# Patient Record
Sex: Female | Born: 1940 | Race: Black or African American | Hispanic: No | Marital: Married | Smoking: Never smoker
Health system: Southern US, Community
[De-identification: ages and names within clinical notes are randomized; demographics above are authoritative.]

## PROBLEM LIST (undated history)

## (undated) DIAGNOSIS — E119 Type 2 diabetes mellitus without complications: Secondary | ICD-10-CM

## (undated) DIAGNOSIS — I10 Essential (primary) hypertension: Secondary | ICD-10-CM

## (undated) DIAGNOSIS — E785 Hyperlipidemia, unspecified: Secondary | ICD-10-CM

## (undated) HISTORY — DX: Essential (primary) hypertension: I10

## (undated) HISTORY — PX: REPLACEMENT TOTAL KNEE: SUR1224

## (undated) HISTORY — DX: Type 2 diabetes mellitus without complications: E11.9

## (undated) HISTORY — DX: Hyperlipidemia, unspecified: E78.5

---

## 2012-01-04 ENCOUNTER — Emergency Department: Payer: Self-pay | Admitting: Internal Medicine

## 2015-03-27 ENCOUNTER — Other Ambulatory Visit: Payer: Self-pay | Admitting: Unknown Physician Specialty

## 2015-03-27 DIAGNOSIS — M1712 Unilateral primary osteoarthritis, left knee: Secondary | ICD-10-CM

## 2015-03-27 DIAGNOSIS — M1711 Unilateral primary osteoarthritis, right knee: Secondary | ICD-10-CM

## 2015-04-05 ENCOUNTER — Ambulatory Visit
Admission: RE | Admit: 2015-04-05 | Discharge: 2015-04-05 | Disposition: A | Discharge status: Home / Self Care | Payer: Medicare Other | Source: Ambulatory Visit | Attending: Unknown Physician Specialty | Admitting: Unknown Physician Specialty

## 2015-04-05 DIAGNOSIS — M7122 Synovial cyst of popliteal space [Baker], left knee: Secondary | ICD-10-CM | POA: Insufficient documentation

## 2015-04-05 DIAGNOSIS — M7051 Other bursitis of knee, right knee: Secondary | ICD-10-CM | POA: Insufficient documentation

## 2015-04-05 DIAGNOSIS — M7121 Synovial cyst of popliteal space [Baker], right knee: Secondary | ICD-10-CM | POA: Diagnosis not present

## 2015-04-05 DIAGNOSIS — S83242A Other tear of medial meniscus, current injury, left knee, initial encounter: Secondary | ICD-10-CM | POA: Diagnosis not present

## 2015-04-05 DIAGNOSIS — M1711 Unilateral primary osteoarthritis, right knee: Secondary | ICD-10-CM | POA: Insufficient documentation

## 2015-04-05 DIAGNOSIS — S83241A Other tear of medial meniscus, current injury, right knee, initial encounter: Secondary | ICD-10-CM | POA: Diagnosis not present

## 2015-04-05 DIAGNOSIS — M25462 Effusion, left knee: Secondary | ICD-10-CM | POA: Insufficient documentation

## 2015-04-05 DIAGNOSIS — M1712 Unilateral primary osteoarthritis, left knee: Secondary | ICD-10-CM

## 2015-04-05 DIAGNOSIS — X58XXXA Exposure to other specified factors, initial encounter: Secondary | ICD-10-CM | POA: Diagnosis not present

## 2015-07-03 ENCOUNTER — Other Ambulatory Visit
Admission: RE | Admit: 2015-07-03 | Discharge: 2015-07-03 | Disposition: A | Discharge status: Home / Self Care | Payer: Medicare Other | Source: Ambulatory Visit | Attending: Unknown Physician Specialty | Admitting: Unknown Physician Specialty

## 2015-07-03 DIAGNOSIS — M1712 Unilateral primary osteoarthritis, left knee: Secondary | ICD-10-CM | POA: Diagnosis present

## 2015-07-03 LAB — SYNOVIAL CELL COUNT + DIFF, W/ CRYSTALS
Crystals, Fluid: NONE SEEN
Eosinophils-Synovial: 1 %
LYMPHOCYTES-SYNOVIAL FLD: 12 %
Monocyte-Macrophage-Synovial Fluid: 6 %
Neutrophil, Synovial: 81 %
WBC, Synovial: 16108 /mm3 — ABNORMAL HIGH (ref 0–200)

## 2015-07-07 LAB — BODY FLUID CULTURE: Culture: NO GROWTH

## 2016-07-02 ENCOUNTER — Ambulatory Visit (INDEPENDENT_AMBULATORY_CARE_PROVIDER_SITE_OTHER): Payer: Medicare Other | Admitting: Vascular Surgery

## 2016-07-09 ENCOUNTER — Ambulatory Visit (INDEPENDENT_AMBULATORY_CARE_PROVIDER_SITE_OTHER): Payer: Medicare Other | Admitting: Vascular Surgery

## 2016-07-23 ENCOUNTER — Encounter (INDEPENDENT_AMBULATORY_CARE_PROVIDER_SITE_OTHER): Payer: Self-pay | Admitting: Vascular Surgery

## 2016-07-23 ENCOUNTER — Ambulatory Visit (INDEPENDENT_AMBULATORY_CARE_PROVIDER_SITE_OTHER): Payer: Medicare Other | Admitting: Vascular Surgery

## 2016-07-23 DIAGNOSIS — E119 Type 2 diabetes mellitus without complications: Secondary | ICD-10-CM

## 2016-07-23 DIAGNOSIS — I890 Lymphedema, not elsewhere classified: Secondary | ICD-10-CM | POA: Diagnosis not present

## 2016-07-23 DIAGNOSIS — M150 Primary generalized (osteo)arthritis: Secondary | ICD-10-CM

## 2016-07-23 DIAGNOSIS — I10 Essential (primary) hypertension: Secondary | ICD-10-CM

## 2016-07-23 DIAGNOSIS — M159 Polyosteoarthritis, unspecified: Secondary | ICD-10-CM

## 2016-07-26 DIAGNOSIS — I10 Essential (primary) hypertension: Secondary | ICD-10-CM | POA: Insufficient documentation

## 2016-07-26 DIAGNOSIS — E119 Type 2 diabetes mellitus without complications: Secondary | ICD-10-CM | POA: Insufficient documentation

## 2016-07-26 DIAGNOSIS — M1990 Unspecified osteoarthritis, unspecified site: Secondary | ICD-10-CM | POA: Insufficient documentation

## 2016-07-26 DIAGNOSIS — I890 Lymphedema, not elsewhere classified: Secondary | ICD-10-CM | POA: Insufficient documentation

## 2016-07-26 NOTE — Progress Notes (Signed)
MRN : [id number]  [name] is a 76 y.o. (11-17-1940) female who presents with chief complaint of  Chief Complaint  Patient presents with  . Follow-up  .  History of Present Illness: The patient returns to the office for followup evaluation regarding leg swelling.  The swelling has improved quite a bit and the pain associated with swelling has decreased substantially. There have not been any interval development of a ulcerations or wounds.  Since the previous visit the patient has been wearing graduated compression stockings and has noted little significant improvement in the lymphedema. The patient has been using compression routinely morning until night.  The patient also states elevation during the day and exercise is being done too.    Current Meds  Medication Sig  . acetaminophen (TYLENOL) 500 MG tablet Take 500 mg by mouth every 6 (six) hours as needed.   Marland Kitchen. amLODipine (NORVASC) 5 MG tablet Take by mouth daily.   Marland Kitchen. aspirin EC 81 MG tablet Take 81 mg by mouth.  Marland Kitchen. atorvastatin (LIPITOR) 40 MG tablet Take 40 mg by mouth.  . calcium carbonate (CALCIUM 600) 600 MG TABS tablet Take by mouth daily.   Marland Kitchen. levothyroxine (SYNTHROID, LEVOTHROID) 88 MCG tablet Take by mouth daily.   . metFORMIN (GLUCOPHAGE) 500 MG tablet Take 500 mg by mouth 2 (two) times daily.   . vitamin B-12 (CYANOCOBALAMIN) 500 MCG tablet Take by mouth daily.     Past Medical History:  Diagnosis Date  . Diabetes mellitus without complication (HCC)   . Hyperlipidemia   . Hypertension     Past Surgical History:  Procedure Laterality Date  . REPLACEMENT TOTAL KNEE Left     Social History Social History  Substance Use Topics  . Smoking status: Never Smoker  . Smokeless tobacco: Never Used  . Alcohol use No    Family History Family History  Problem Relation Age of Onset  . Stroke Mother   . Heart disease Father   No family history of bleeding/clotting disorders, porphyria or autoimmune  disease   No Known Allergies   REVIEW OF SYSTEMS (Negative unless checked)  Constitutional: [] Weight loss  [] Fever  [] Chills Cardiac: [] Chest pain   [] Chest pressure   [] Palpitations   [] Shortness of breath when laying flat   [] Shortness of breath with exertion. Vascular:  [] Pain in legs with walking   [] Pain in legs at rest  [] History of DVT   [] Phlebitis   [x] Swelling in legs   [] Varicose veins   [] Non-healing ulcers Pulmonary:   [] Uses home oxygen   [] Productive cough   [] Hemoptysis   [] Wheeze  [] COPD   [] Asthma Neurologic:  [] Dizziness   [] Seizures   [] History of stroke   [] History of TIA  [] Aphasia   [] Vissual changes   [] Weakness or numbness in arm   [] Weakness or numbness in leg Musculoskeletal:   [] Joint swelling   [] Joint pain   [] Low back pain Hematologic:  [] Easy bruising  [] Easy bleeding   [] Hypercoagulable state   [] Anemic Gastrointestinal:  [] Diarrhea   [] Vomiting  [] Gastroesophageal reflux/heartburn   [] Difficulty swallowing. Genitourinary:  [] Chronic kidney disease   [] Difficult urination  [] Frequent urination   [] Blood in urine Skin:  [] Rashes   [] Ulcers  Psychological:  [] History of anxiety   []  History of major depression.  Physical Examination  Vitals:   07/23/16 1318  BP: 134/78  Pulse: 69  Resp: 16  Weight: 222 lb (100.7 kg)  Height: 5\' 4"  (1.626 m)   Body mass  index is 38.11 kg/m. Gen: WD/WN, NAD Head: [place]/AT, No temporalis wasting.  Ear/Nose/Throat: Hearing grossly intact, nares w/o erythema or drainage, poor dentition Eyes: PER, EOMI, sclera nonicteric.  Neck: Supple, no masses.  No bruit or JVD.  Pulmonary:  Good air movement, clear to auscultation bilaterally, no use of accessory muscles.  Cardiac: RRR, normal S1, S2, no Murmurs. Vascular:  2+ edema left > right ankle area pitting Vessel Right Left  Radial Palpable Palpable  Ulnar Palpable Palpable  Brachial Palpable Palpable  Carotid Palpable Palpable  Femoral Palpable Palpable  Popliteal  Palpable Palpable  PT Palpable Palpable  DP Palpable Palpable   Gastrointestinal: soft, non-distended. No guarding/no peritoneal signs.  Musculoskeletal: M/S 5/5 throughout.  No deformity or atrophy.  Neurologic: CN 2-12 intact. Pain and light touch intact in extremities.  Symmetrical.  Speech is fluent. Motor exam as listed above. Psychiatric: Judgment intact, Mood & affect appropriate for pt's clinical situation. Dermatologic: No rashes or ulcers noted.  No changes consistent with cellulitis. Lymph : No Cervical lymphadenopathy, no lichenification or skin changes of chronic lymphedema.  CBC No results found for: WBC, HGB, HCT, MCV, PLT  BMET No results found for: NA, K, CL, CO2, GLUCOSE, BUN, CREATININE, CALCIUM, GFRNONAA, GFRAA CrCl cannot be calculated (No order found.).  COAG No results found for: INR, PROTIME  Radiology No results found.   Assessment/Plan 1. Lymphedema No surgery or intervention at this point in time.  I have reviewed my discussion with the patient regarding venous insufficiency and why it causes symptoms. I have discussed with the patient the chronic skin changes that accompany venous insufficiency and the long term sequela such as ulceration. Patient will contnue wearing graduated compression stockings on a daily basis, as this has provided excellent control of his edema. The patient will put the stockings on first thing in the morning and removing them in the evening. The patient is reminded not to sleep in the stockings.  In addition, behavioral modification including elevation during the day will be initiated. Exercise is strongly encouraged.  Given the patient's good control and lack of any problems regarding the venous insufficiency and lymphedema a lymph pump in not need at this time.  The patient will follow up with me PRN should anything change.  The patient voices agreement with this plan.   2. Primary osteoarthritis involving multiple  joints Continue NSAID medications as already ordered, these medications have been reviewed and there are no changes at this time.   3. Controlled type 2 diabetes mellitus without complication, without long-term current use of insulin (HCC) Continue hypoglycemic medications as already ordered, these medications have been reviewed and there are no changes at this time.  Hgb A1C to be monitored as already arranged by primary service   4. Essential hypertension Continue antihypertensive medications as already ordered, these medications have been reviewed and there are no changes at this time.   Levora Dredge, MD  07/26/2016 1:28 PM

## 2018-07-17 ENCOUNTER — Ambulatory Visit
Admission: EM | Admit: 2018-07-17 | Discharge: 2018-07-17 | Disposition: A | Discharge status: Home / Self Care | Payer: Medicare Other | Attending: Family Medicine | Admitting: Family Medicine

## 2018-07-17 ENCOUNTER — Ambulatory Visit (INDEPENDENT_AMBULATORY_CARE_PROVIDER_SITE_OTHER): Payer: Medicare Other

## 2018-07-17 ENCOUNTER — Encounter: Payer: Self-pay | Admitting: Emergency Medicine

## 2018-07-17 ENCOUNTER — Other Ambulatory Visit: Payer: Self-pay

## 2018-07-17 DIAGNOSIS — J069 Acute upper respiratory infection, unspecified: Secondary | ICD-10-CM

## 2018-07-17 MED ORDER — DOXYCYCLINE HYCLATE 100 MG PO CAPS: 100.0000 mg | ORAL_CAPSULE | Freq: Two times a day (BID) | ORAL | 0 refills | Status: DC

## 2018-07-17 MED ORDER — BENZONATATE 100 MG PO CAPS: 100.0000 mg | ORAL_CAPSULE | Freq: Three times a day (TID) | ORAL | 0 refills | Status: DC | PRN

## 2018-07-17 NOTE — ED Provider Notes (Signed)
[facility] ____________________________________________  Time seen: Approximately 10:14 AM  I have reviewed the triage vital signs and the nursing notes.   HISTORY  Chief Complaint Cough   HPI [name] is a 78 y.o. female presenting with spouse at bedside for evaluation of 1 week of cough and congestion complaints.  States initially felt like it started off only has a cold, but reports is moved more to her chest.  Still has some nasal congestion.  No sinus pain.  States feels like she can hear somewhat of a wheeze sound at night.  Intermittent cough.  Denies chest pain or shortness of breath.  Denies known fevers.  Has continued to eat and drink well.  Has not been taken over the counter medication for the same complaints.  Denies known sick contacts.  Reports otherwise doing well denies other complaints.  Danielle Dess, MD: PCP    Past Medical History:  Diagnosis Date  . Diabetes mellitus without complication (HCC)   . Hyperlipidemia   . Hypertension     Patient Active Problem List   Diagnosis Date Noted  . Lymphedema 07/26/2016  . DJD (degenerative joint disease) 07/26/2016  . Diabetes type 2, controlled (HCC) 07/26/2016  . Essential hypertension 07/26/2016    Past Surgical History:  Procedure Laterality Date  . REPLACEMENT TOTAL KNEE Left      No current facility-administered medications for this encounter.   Current Outpatient Medications:  .  amLODipine (NORVASC) 5 MG tablet, Take by mouth daily. , Disp: , Rfl:  .  aspirin EC 81 MG tablet, Take 81 mg by mouth., Disp: , Rfl:  .  atorvastatin (LIPITOR) 40 MG tablet, Take 40 mg by mouth., Disp: , Rfl:  .  calcium carbonate (CALCIUM 600) 600 MG TABS tablet, Take by mouth daily. , Disp: , Rfl:  .  levothyroxine (SYNTHROID, LEVOTHROID) 88 MCG tablet, Take by mouth daily. , Disp: , Rfl:  .  metFORMIN (GLUCOPHAGE) 500 MG tablet, Take 500 mg by mouth 2 (two) times daily. , Disp: , Rfl:  .   vitamin B-12 (CYANOCOBALAMIN) 500 MCG tablet, Take by mouth daily. , Disp: , Rfl:  .  acetaminophen (TYLENOL) 500 MG tablet, Take 500 mg by mouth every 6 (six) hours as needed. , Disp: , Rfl:  .  benzonatate (TESSALON PERLES) 100 MG capsule, Take 1 capsule (100 mg total) by mouth 3 (three) times daily as needed for cough., Disp: 15 capsule, Rfl: 0 .  doxycycline (VIBRAMYCIN) 100 MG capsule, Take 1 capsule (100 mg total) by mouth 2 (two) times daily., Disp: 20 capsule, Rfl: 0  Allergies Patient has no known allergies.  Family History  Problem Relation Age of Onset  . Stroke Mother   . Heart disease Father     Social History Social History   Tobacco Use  . Smoking status: Never Smoker  . Smokeless tobacco: Never Used  Substance Use Topics  . Alcohol use: No  . Drug use: No    Review of Systems Constitutional: No fever/chills ENT: No sore throat. As above.  Cardiovascular: Denies chest pain. Respiratory: Denies shortness of breath. Gastrointestinal: No abdominal pain.  Genitourinary: Negative for dysuria. Musculoskeletal: Negative for back pain. Denies extremity edema.  Skin: Negative for rash.  ____________________________________________   PHYSICAL EXAM:  VITAL SIGNS: ED Triage Vitals  Enc Vitals Group     BP 07/17/18 0928 135/73     Pulse Rate 07/17/18 0928 60     Resp 07/17/18 0928 16  Temp 07/17/18 0928 98.5 F (36.9 C)     Temp Source 07/17/18 0928 Oral     SpO2 07/17/18 0928 98 %     Weight 07/17/18 0926 209 lb (94.8 kg)     Height 07/17/18 0926 5\' 4"  (1.626 m)     Head Circumference --      Peak Flow --      Pain Score 07/17/18 0926 0     Pain Loc --      Pain Edu? --      Excl. in [place]? --    Constitutional: Alert and oriented. Well appearing and in no acute distress. Eyes: Conjunctivae are normal.  Head: Atraumatic. No sinus tenderness to palpation. No swelling. No erythema.  Ears: no erythema, normal TMs bilaterally.   Nose:Nasal  congestion  Mouth/Throat: Mucous membranes are moist. No pharyngeal erythema. No tonsillar swelling or exudate.  Neck: No stridor.  No cervical spine tenderness to palpation. Hematological/Lymphatic/Immunilogical: No cervical lymphadenopathy. Cardiovascular: Normal rate, regular rhythm. Grossly normal heart sounds.  Good peripheral circulation. Respiratory: Normal respiratory effort.  No retractions. No wheezes. Mild scattered rhonchi. Good air movement.  Musculoskeletal: Ambulatory with steady gait. No lower extremity edema noted.  Neurologic:  Normal speech and language. No gait instability. Skin:  Skin appears warm, dry and intact. No rash noted. Psychiatric: Mood and affect are normal. Speech and behavior are normal. ___________________________________________   LABS (all labs ordered are listed, but only abnormal results are displayed)  Labs Reviewed - No data to display  RADIOLOGY  Dg Chest 2 View  Result Date: 07/17/2018 CLINICAL DATA:  Cough and congestion EXAM: CHEST - 2 VIEW COMPARISON:  None. FINDINGS: There is elevation of the left hemidiaphragm. There is no edema or consolidation. The heart size and pulmonary vascularity are normal. Aorta is mildly prominent in the arch region. No adenopathy. There is degenerative change in the thoracic spine. IMPRESSION: There is a degree of left hemidiaphragm elevation of uncertain chronicity. No edema or consolidation. Mild prominence of the aorta in the arch region may be indicative of chronic hypertension. Electronically Signed   By: Bretta Bang III M.D.   On: 07/17/2018 10:36   ____________________________________________   PROCEDURES Procedures   INITIAL IMPRESSION / ASSESSMENT AND PLAN / ED COURSE  Pertinent labs & imaging results that were available during my care of the patient were reviewed by me and considered in my medical decision making (see chart for details).  Well-appearing patient.  Cough and congestion  complaints, scattered rhonchi, evaluated chest x-ray.  Chest x-ray as above per radiologist and reviewed results with patient, no consolidation or edema. Follow-up with primary care this week.  Will treat with oral doxycycline and PRN Tessalon Perles.  Encourage rest, fluids, supportive care.Discussed indication, risks and benefits of medications with patient.  Discussed follow up with Primary care physician this week. Discussed follow up and return parameters including no resolution or any worsening concerns. Patient verbalized understanding and agreed to plan.   ____________________________________________   FINAL CLINICAL IMPRESSION(S) / ED DIAGNOSES  Final diagnoses:  Upper respiratory tract infection, unspecified type     ED Discharge Orders         Ordered    doxycycline (VIBRAMYCIN) 100 MG capsule  2 times daily     07/17/18 1044    benzonatate (TESSALON PERLES) 100 MG capsule  3 times daily PRN     07/17/18 1044           Note: This dictation was prepared  with Dragon dictation along with smaller phrase technology. Any transcriptional errors that result from this process are unintentional.         Renford DillsMiller, Hydeia Mcatee, NP 07/17/18 1221

## 2018-07-17 NOTE — ED Triage Notes (Signed)
Patient c/o cough and chest congestion for a week.  

## 2018-07-17 NOTE — Discharge Instructions (Addendum)
Take medication as prescribed. Rest. Drink plenty of fluids.  ° °Follow up with your primary care physician this week for follow up. Return to Urgent care for new or worsening concerns.  ° °

## 2019-09-23 ENCOUNTER — Ambulatory Visit
Admission: EM | Admit: 2019-09-23 | Discharge: 2019-09-23 | Disposition: A | Discharge status: Home / Self Care | Payer: Medicare PPO | Attending: Family Medicine | Admitting: Family Medicine

## 2019-09-23 ENCOUNTER — Other Ambulatory Visit: Payer: Self-pay

## 2019-09-23 ENCOUNTER — Ambulatory Visit (INDEPENDENT_AMBULATORY_CARE_PROVIDER_SITE_OTHER): Payer: Medicare PPO

## 2019-09-23 DIAGNOSIS — M2550 Pain in unspecified joint: Secondary | ICD-10-CM | POA: Diagnosis not present

## 2019-09-23 DIAGNOSIS — M25571 Pain in right ankle and joints of right foot: Secondary | ICD-10-CM | POA: Diagnosis not present

## 2019-09-23 MED ORDER — PREDNISONE 10 MG PO TABS: ORAL_TABLET | ORAL | 0 refills | Status: DC

## 2019-09-23 NOTE — ED Triage Notes (Signed)
Patient states that she has been having right ankle pain with swelling that is off and on x 1 month. Patient states that she is also having right shoulder pain and right hand pain. Denies any known injury.

## 2019-09-23 NOTE — Discharge Instructions (Signed)
Prednisone as prescribed.  Follow up with PCP regarding referral to rheumatology.  Take care  Dr. Adriana Simas

## 2019-09-23 NOTE — ED Provider Notes (Signed)
[facility]    CSN: [id number] Arrival date & time: 09/23/19  1429      History   Chief Complaint Chief Complaint  Patient presents with  . Ankle Pain    right   HPI  79 year old female presents with joint pain.  Patient reports that she has had ongoing joint pain for the past 3 to 4 weeks.  She reports she is particularly bothered by right ankle pain and swelling.  She reports difficulty with activity.  Seems to be worse with activity.  No relieving factors.  She has been seen by her primary care physician regarding this.  I have reviewed the note and her laboratory studies.  No recent injury.  Rates her pain as 9/10 in severity.  She also reports ongoing pain of the hands as well as the shoulders bilaterally.  No other associated symptoms.  No other complaints.  Past Medical History:  Diagnosis Date  . Diabetes mellitus without complication ([place])   . Hyperlipidemia   . Hypertension     Patient Active Problem List   Diagnosis Date Noted  . Lymphedema 07/26/2016  . DJD (degenerative joint disease) 07/26/2016  . Diabetes type 2, controlled ([place]) 07/26/2016  . Essential hypertension 07/26/2016    Past Surgical History:  Procedure Laterality Date  . REPLACEMENT TOTAL KNEE Left     OB History   No obstetric history on file.      Home Medications    Prior to Admission medications   Medication Sig Start Date End Date Taking? Authorizing Provider  acetaminophen (TYLENOL) 500 MG tablet Take 500 mg by mouth every 6 (six) hours as needed.    Yes [provider]  amLODipine (NORVASC) 5 MG tablet Take by mouth daily.  05/23/14  Yes [provider]  aspirin EC 81 MG tablet Take 81 mg by mouth. 07/25/12  Yes [provider]  atorvastatin (LIPITOR) 40 MG tablet Take 40 mg by mouth. 07/22/16 09/23/19 Yes [provider]  calcium carbonate (CALCIUM 600) 600 MG TABS tablet Take by mouth daily.    Yes [provider]    levothyroxine (SYNTHROID, LEVOTHROID) 88 MCG tablet Take by mouth daily.  10/15/15  Yes [provider]  metFORMIN (GLUCOPHAGE) 500 MG tablet Take 500 mg by mouth 2 (two) times daily.  05/26/16 09/23/19 Yes [provider]  vitamin B-12 (CYANOCOBALAMIN) 500 MCG tablet Take by mouth daily.    Yes [provider]  predniSONE (DELTASONE) 10 MG tablet 50 mg daily x 2 days, then 40 mg daily x 2 days, then 30 mg daily x 2 days, then 20 mg daily x 2 days, then 10 mg daily x 2 days. 09/23/19   Coral Spikes, DO    Family History Family History  Problem Relation Age of Onset  . Stroke Mother   . Heart disease Father     Social History Social History   Tobacco Use  . Smoking status: Never Smoker  . Smokeless tobacco: Never Used  Substance Use Topics  . Alcohol use: No  . Drug use: No     Allergies   Patient has no known allergies.  Review of Systems Review of Systems  Constitutional: Negative for fever.  Musculoskeletal: Positive for arthralgias.   Physical Exam Triage Vital Signs ED Triage Vitals  Enc Vitals Group     BP 09/23/19 1445 (!) 172/91     Pulse Rate 09/23/19 1445 67     Resp 09/23/19 1445 18  Temp 09/23/19 1445 98 F (36.7 C)     Temp Source 09/23/19 1445 Oral     SpO2 09/23/19 1445 100 %     Weight 09/23/19 1442 200 lb (90.7 kg)     Height 09/23/19 1442 5' 4"  (1.626 m)     Head Circumference --      Peak Flow --      Pain Score 09/23/19 1441 9     Pain Loc --      Pain Edu? --      Excl. in [place]? --    Updated Vital Signs BP (!) 172/91 (BP Location: Right Arm)   Pulse 67   Temp 98 F (36.7 C) (Oral)   Resp 18   Ht 5' 4"  (1.626 m)   Wt 90.7 kg   SpO2 100%   BMI 34.33 kg/m   Visual Acuity Right Eye Distance:   Left Eye Distance:   Bilateral Distance:    Right Eye Near:   Left Eye Near:    Bilateral Near:     Physical Exam Vitals and nursing note reviewed.  Constitutional:      General: She is not in acute  distress.    Appearance: Normal appearance.  Eyes:     General:        Right eye: No discharge.        Left eye: No discharge.     Conjunctiva/sclera: Conjunctivae normal.  Cardiovascular:     Rate and Rhythm: Normal rate and regular rhythm.     Heart sounds: No murmur.  Pulmonary:     Effort: Pulmonary effort is normal.     Breath sounds: Normal breath sounds. No wheezing, rhonchi or rales.  Musculoskeletal:     Comments: Right ankle -swelling, and warmth noted.  Tender to palpation over the medial malleolus.  Hands - no apparent synovitis at this time.  Neurological:     Mental Status: She is alert.  Psychiatric:        Mood and Affect: Mood normal.        Behavior: Behavior normal.    UC Treatments / Results  Labs (all labs ordered are listed, but only abnormal results are displayed) Labs Reviewed - No data to display  EKG   Radiology DG Ankle Complete Right  Result Date: 09/23/2019 CLINICAL DATA:  Pain and swelling for 1 month EXAM: RIGHT ANKLE - COMPLETE 3+ VIEW COMPARISON:  None. FINDINGS: There is no evidence of fracture, dislocation, or joint effusion. Plantar and posterior calcaneal enthesophytes are noted. Soft tissue swelling surrounds the ankle. IMPRESSION: No acute osseous abnormality. Soft tissue swelling surrounds the ankle. Electronically Signed   By: Zerita Boers M.D.   On: 09/23/2019 15:13    Procedures Procedures (including critical care time)  Medications Ordered in UC Medications - No data to display  Initial Impression / Assessment and Plan / UC Course  I have reviewed the triage vital signs and the nursing notes.  Pertinent labs & imaging results that were available during my care of the patient were reviewed by me and considered in my medical decision making (see chart for details).    79 year old female presents with polyarthralgia.  She is most bothered by right ankle pain and swelling.    Patient recently seen by her primary care  physician on 3/19.  Note was reviewed and is summarized as follows (as it relates to her current complaints): Reported right ankle pain, pain and stiffness in her hands.  Concern for  rheumatologic disease.  Laboratory studies obtained.   Labs were reviewed: Uric acid 5.8.  ESR elevated at 44.  CRP elevated at 15.4.  Rheumatoid factor normal at 11.8.  ANA was positive.  CCP was elevated at 69.  Labs concerning for RA.  Referral to rheumatology has been placed by her primary care physician.  X-ray of the right ankle obtained today.  Swelling was noted.  No osseous abnormality.  Based off of presentation, review of her primary care physician's note, review of her recent laboratory studies, and her clinical exam today I am concerned that she has rheumatoid arthritis.  Placing on burst of prednisone.  Advised follow-up with primary care physician regarding status of referral to rheumatology.  Final Clinical Impressions(s) / UC Diagnoses   Final diagnoses:  Polyarthralgia     Discharge Instructions     Prednisone as prescribed.  Follow up with PCP regarding referral to rheumatology.  Take care  Dr. Lacinda Axon    ED Prescriptions    Medication Sig Dispense Auth. Provider   predniSONE (DELTASONE) 10 MG tablet 50 mg daily x 2 days, then 40 mg daily x 2 days, then 30 mg daily x 2 days, then 20 mg daily x 2 days, then 10 mg daily x 2 days. 30 tablet Coral Spikes, DO     PDMP not reviewed this encounter.   Coral Spikes, [place] 09/23/19 1532

## 2021-01-24 ENCOUNTER — Ambulatory Visit
Admission: EM | Admit: 2021-01-24 | Discharge: 2021-01-24 | Disposition: A | Discharge status: Home / Self Care | Payer: Medicare PPO | Attending: Family Medicine | Admitting: Family Medicine

## 2021-01-24 ENCOUNTER — Ambulatory Visit (INDEPENDENT_AMBULATORY_CARE_PROVIDER_SITE_OTHER): Payer: Medicare PPO

## 2021-01-24 ENCOUNTER — Other Ambulatory Visit: Payer: Self-pay

## 2021-01-24 DIAGNOSIS — M25562 Pain in left knee: Secondary | ICD-10-CM

## 2021-01-24 DIAGNOSIS — M25512 Pain in left shoulder: Secondary | ICD-10-CM

## 2021-01-24 DIAGNOSIS — R079 Chest pain, unspecified: Secondary | ICD-10-CM

## 2021-01-24 DIAGNOSIS — M25561 Pain in right knee: Secondary | ICD-10-CM | POA: Diagnosis not present

## 2021-01-24 DIAGNOSIS — M7918 Myalgia, other site: Secondary | ICD-10-CM

## 2021-01-24 DIAGNOSIS — W19XXXA Unspecified fall, initial encounter: Secondary | ICD-10-CM | POA: Diagnosis not present

## 2021-01-24 MED ORDER — TRAMADOL HCL 50 MG PO TABS: 50.0000 mg | ORAL_TABLET | Freq: Two times a day (BID) | ORAL | 0 refills | Status: DC | PRN

## 2021-01-24 NOTE — Discharge Instructions (Addendum)
Rest.  Ice.  Medication as needed.  Take care  Dr.Malakye Nolden  

## 2021-01-24 NOTE — ED Triage Notes (Signed)
Patient states that she fell inside the [employer] [facility]. States that she tripped on a rug and fell face first on the ground. Patient states that she has left arm pain and bilateral knee pain with chest wall pain as well. States that this happen around 20 mins ago.

## 2021-01-25 NOTE — ED Provider Notes (Signed)
[facility]    CSN: [id number] Arrival date & time: 01/24/21  1847      History   Chief Complaint Chief Complaint  Patient presents with   Fall   Chest Pain   Arm Pain    left    HPI  80 year old female presents with the above complaints.  Patient was at a gas station and tripped on a rug and fell forward.  No head injury.  She reports bilateral knee pain, left shoulder pain, and some anterior chest discomfort from the fall.  Pain 8/10 in severity.  No relieving factors.  Happened approximate 20 minutes prior to arrival.  Patient is concerned and would like x-ray imaging today.  No relieving factors.  No other associated symptoms.  No other complaints.  Past Medical History:  Diagnosis Date   Diabetes mellitus without complication (HCC)    Hyperlipidemia    Hypertension     Patient Active Problem List   Diagnosis Date Noted   Lymphedema 07/26/2016   DJD (degenerative joint disease) 07/26/2016   Diabetes type 2, controlled (HCC) 07/26/2016   Essential hypertension 07/26/2016    Past Surgical History:  Procedure Laterality Date   REPLACEMENT TOTAL KNEE Left     OB History   No obstetric history on file.      Home Medications    Prior to Admission medications   Medication Sig Start Date End Date Taking? Authorizing Provider  acetaminophen (TYLENOL) 500 MG tablet Take 500 mg by mouth every 6 (six) hours as needed.    Yes [provider]  amLODipine-benazepril (LOTREL) 5-10 MG capsule Take 1 capsule by mouth daily. 01/02/21  Yes [provider]  calcium carbonate (OS-CAL) 600 MG TABS tablet Take by mouth daily.    Yes [provider]  gabapentin (NEURONTIN) 100 MG capsule Take by mouth. 12/24/20  Yes [provider]  levothyroxine (SYNTHROID) 100 MCG tablet  04/23/16  Yes [provider]  meloxicam (MOBIC) 7.5 MG tablet Take 7.5 mg by mouth daily. 01/14/21  Yes [provider]  metFORMIN  (GLUCOPHAGE-XR) 500 MG 24 hr tablet SMARTSIG:1 Tablet(s) By Mouth Every Evening 01/03/21  Yes [provider]  pravastatin (PRAVACHOL) 40 MG tablet Take 40 mg by mouth daily. 01/16/21  Yes [provider]  traMADol (ULTRAM) 50 MG tablet Take 1 tablet (50 mg total) by mouth every 12 (twelve) hours as needed. 01/24/21  Yes Colon Rueth G, DO  vitamin B-12 (CYANOCOBALAMIN) 500 MCG tablet Take by mouth daily.    Yes [provider]    Family History Family History  Problem Relation Age of Onset   Stroke Mother    Heart disease Father     Social History Social History   Tobacco Use   Smoking status: Never   Smokeless tobacco: Never  Vaping Use   Vaping Use: Never used  Substance Use Topics   Alcohol use: No   Drug use: No     Allergies   Patient has no known allergies.   Review of Systems Review of Systems Per HPI  Physical Exam Triage Vital Signs ED Triage Vitals  Enc Vitals Group     BP 01/24/21 1901 (!) 174/90     Pulse Rate 01/24/21 1901 83     Resp 01/24/21 1901 18     Temp 01/24/21 1901 98.1 F (36.7 C)     Temp Source 01/24/21 1901 Oral     SpO2 01/24/21 1901 100 %  Weight 01/24/21 1858 199 lb 15.3 oz (90.7 kg)     Height 01/24/21 1858 5\' 4"  (1.626 m)     Head Circumference --      Peak Flow --      Pain Score 01/24/21 1858 8     Pain Loc --      Pain Edu? --      Excl. in GC? --    Updated Vital Signs BP (!) 174/90 (BP Location: Right Arm)   Pulse 83   Temp 98.1 F (36.7 C) (Oral)   Resp 18   Ht 5\' 4"  (1.626 m)   Wt 90.7 kg   SpO2 100%   BMI 34.32 kg/m   Visual Acuity Right Eye Distance:   Left Eye Distance:   Bilateral Distance:    Right Eye Near:   Left Eye Near:    Bilateral Near:     Physical Exam Vitals and nursing note reviewed.  Constitutional:      General: She is not in acute distress.    Appearance: Normal appearance. She is not ill-appearing.  HENT:     Head: Normocephalic and atraumatic.   Cardiovascular:     Rate and Rhythm: Normal rate and regular rhythm.  Pulmonary:     Effort: Pulmonary effort is normal.     Breath sounds: Normal breath sounds. No wheezing, rhonchi or rales.  Musculoskeletal:     Comments: Mild tenderness over the right knee.  No abrasions noted to the knees.  Decreased range of motion of the left shoulder.  Neurological:     Mental Status: She is alert.  Psychiatric:        Mood and Affect: Mood normal.        Behavior: Behavior normal.     UC Treatments / Results  Labs (all labs ordered are listed, but only abnormal results are displayed) Labs Reviewed - No data to display  EKG   Radiology DG Chest 2 View  Result Date: 01/24/2021 CLINICAL DATA:  Fall, injury. Fall inside [employer] station, tripped on a rug. EXAM: CHEST - 2 VIEW COMPARISON:  07/17/2018 FINDINGS: Chronic elevation of left hemidiaphragm. Stable heart size and mediastinal contours with aortic tortuosity. No pneumothorax, pleural effusion, or focal airspace disease. No pulmonary edema. Thoracic spondylosis. No acute osseous abnormalities are seen. IMPRESSION: No acute chest findings. Stable chronic elevation of left hemidiaphragm. Electronically Signed   By: 01/26/2021 M.D.   On: 01/24/2021 20:02   DG Shoulder Left  Result Date: 01/24/2021 CLINICAL DATA:  Fall, injury. Fall inside [employer] station, tripped on a rug. EXAM: LEFT SHOULDER - 2+ VIEW COMPARISON:  None. FINDINGS: There is no evidence of fracture or dislocation. Mild acromioclavicular and glenohumeral osteoarthritis. Small subacromial spur. Intact left ribs. Soft tissues are unremarkable. IMPRESSION: No fracture or subluxation of the left shoulder. Mild acromioclavicular and glenohumeral osteoarthritis. Electronically Signed   By: 01/26/2021 M.D.   On: 01/24/2021 20:00   DG Knee Complete 4 Views Left  Result Date: 01/24/2021 CLINICAL DATA:  Fall, injury. Patient states she fell inside [employer] station. Tripped on  a rug. EXAM: LEFT KNEE - COMPLETE 4+ VIEW COMPARISON:  None. FINDINGS: Left knee arthroplasty. No fracture or periprosthetic lucency. There has been patellar resurfacing. Small knee joint effusion. Small quadriceps tendon enthesophyte. Mild soft tissue edema anteriorly. IMPRESSION: 1. Intact left knee arthroplasty. No acute or periprosthetic fracture. 2. Small knee joint effusion. Electronically Signed   By: 01/26/2021.D.  On: 01/24/2021 19:56   DG Knee Complete 4 Views Right  Result Date: 01/24/2021 CLINICAL DATA:  Fall, injury. Fall inside [employer] station, tripped on a rug. EXAM: RIGHT KNEE - COMPLETE 4+ VIEW COMPARISON:  None. FINDINGS: No fracture or dislocation. Tricompartmental osteoarthritis, prominent in the medial tibiofemoral compartment where there is near complete joint space loss. Tricompartmental peripheral spurring. Subchondral cystic change in the medial tibial plateau. Small knee joint effusion. Quadriceps tendon enthesophyte chronic soft tissue calcifications anteriorly. IMPRESSION: 1. No acute fracture or dislocation. 2. Tricompartmental osteoarthritis, prominent in the medial tibiofemoral compartment. Electronically Signed   By: Narda Rutherford M.D.   On: 01/24/2021 20:00    Procedures Procedures (including critical care time)  Medications Ordered in UC Medications - No data to display  Initial Impression / Assessment and Plan / UC Course  I have reviewed the triage vital signs and the nursing notes.  Pertinent labs & imaging results that were available during my care of the patient were reviewed by me and considered in my medical decision making (see chart for details).    80 year old female presents with musculoskeletal pain after suffering a fall.  X-rays were obtained of the knees as well as the left shoulder and chest.  X-rays were independently interpreted by me.  Interpretation: X-ray of the left knee revealed intact hardware from knee replacement.  No evidence  of fracture.  X-ray of the right knee revealed significant degenerative changes/osteoarthritis.  No fracture.  X-ray of the chest was clear.  X-ray of the left shoulder revealed glenohumeral and AC joint arthritis.  Advised rest, ice.  Tramadol as needed for pain.  Supportive care.  Final Clinical Impressions(s) / UC Diagnoses   Final diagnoses:  Musculoskeletal pain     Discharge Instructions      Rest.  Ice.  Medication as needed.  Take care  Dr. Adriana Simas    ED Prescriptions     Medication Sig Dispense Auth. Provider   traMADol (ULTRAM) 50 MG tablet Take 1 tablet (50 mg total) by mouth every 12 (twelve) hours as needed. 10 tablet Everlene Other G, DO      I have reviewed the PDMP during this encounter.   Everlene Other [place], [place] 01/25/21 [phone]

## 2021-06-25 ENCOUNTER — Other Ambulatory Visit: Payer: Self-pay | Admitting: Family Medicine

## 2021-11-19 ENCOUNTER — Other Ambulatory Visit: Payer: Self-pay | Admitting: Orthopedic Surgery

## 2021-11-19 DIAGNOSIS — M25311 Other instability, right shoulder: Secondary | ICD-10-CM

## 2021-11-19 DIAGNOSIS — S46091A Other injury of muscle(s) and tendon(s) of the rotator cuff of right shoulder, initial encounter: Secondary | ICD-10-CM

## 2021-11-19 DIAGNOSIS — M25511 Pain in right shoulder: Secondary | ICD-10-CM

## 2021-12-06 ENCOUNTER — Ambulatory Visit
Admission: RE | Admit: 2021-12-06 | Discharge: 2021-12-06 | Disposition: A | Discharge status: Home / Self Care | Payer: Medicare PPO | Source: Ambulatory Visit | Attending: Orthopedic Surgery | Admitting: Orthopedic Surgery

## 2021-12-06 DIAGNOSIS — M25511 Pain in right shoulder: Secondary | ICD-10-CM | POA: Insufficient documentation

## 2021-12-06 DIAGNOSIS — S46091A Other injury of muscle(s) and tendon(s) of the rotator cuff of right shoulder, initial encounter: Secondary | ICD-10-CM | POA: Insufficient documentation

## 2021-12-06 DIAGNOSIS — M25311 Other instability, right shoulder: Secondary | ICD-10-CM | POA: Diagnosis present

## 2022-02-22 ENCOUNTER — Encounter: Payer: Self-pay | Admitting: Emergency Medicine

## 2022-02-22 ENCOUNTER — Ambulatory Visit
Admission: EM | Admit: 2022-02-22 | Discharge: 2022-02-22 | Disposition: A | Discharge status: Home / Self Care | Payer: Medicare PPO | Attending: Family Medicine | Admitting: Family Medicine

## 2022-02-22 DIAGNOSIS — M159 Polyosteoarthritis, unspecified: Secondary | ICD-10-CM

## 2022-02-22 MED ORDER — TRAMADOL HCL 50 MG PO TABS: 50.0000 mg | ORAL_TABLET | Freq: Two times a day (BID) | ORAL | 0 refills | Status: DC | PRN

## 2022-02-22 MED ORDER — KETOROLAC TROMETHAMINE 60 MG/2ML IM SOLN
30.0000 mg | Freq: Once | INTRAMUSCULAR | Status: AC
  Administered 2022-02-22: 30 mg via INTRAMUSCULAR

## 2022-02-22 NOTE — Discharge Instructions (Addendum)
Take 2 tablets of extra strength Tylenol twice a day.  If you are not getting any relief from this, take a Tramadol tablet.    Call Dr. Rennie Plowman office to see if it is time for you to get another injection.

## 2022-02-22 NOTE — ED Provider Notes (Addendum)
[facility]    CSN: [id number] Arrival date & time: 02/22/22  0831      History   Chief Complaint Chief Complaint  Patient presents with   Generalized Body Aches   Muscle Pain    HPI [name] is a 81 y.o. female.   HPI   [name] presents with her husband for bilateral shoulder, knee and hand pain. Left shoulder hurts more than right today but it switches back and forth. Her right knee is hurting worse than the left today. She reports "being in a tube a few months ago" after a fall to get imaging.  He has been using diclofenac gel without relief.  Reports she takes 1 extra-strength Tylenol a day.  Patient says she wants to get back to cooking for her family.  She has not had any fever, cough, runny nose, congestion, rash, headache, chest pain, abdominal pain and shortness of breath.  Reports they took blood work recently.  She follows with Dr. Lenard Forth.      Past Medical History:  Diagnosis Date   Diabetes mellitus without complication (HCC)    Hyperlipidemia    Hypertension     Patient Active Problem List   Diagnosis Date Noted   Lymphedema 07/26/2016   DJD (degenerative joint disease) 07/26/2016   Diabetes type 2, controlled (HCC) 07/26/2016   Essential hypertension 07/26/2016    Past Surgical History:  Procedure Laterality Date   REPLACEMENT TOTAL KNEE Left     OB History   No obstetric history on file.      Home Medications    Prior to Admission medications   Medication Sig Start Date End Date Taking? Authorizing Provider  acetaminophen (TYLENOL) 500 MG tablet Take 500 mg by mouth every 6 (six) hours as needed.    Yes [provider]  alendronate (FOSAMAX) 70 MG tablet Take by mouth. 03/11/21 03/11/22 Yes [provider]  amLODipine-olmesartan (AZOR) 5-40 MG tablet Take 1 tablet by mouth daily. 02/18/22 02/11/23 Yes [provider]  gabapentin (NEURONTIN) 100 MG capsule Take by mouth. 12/24/20  Yes [provider]  levothyroxine (SYNTHROID) 100 MCG tablet Take 1 tablet by mouth daily. 02/13/22 02/13/23 Yes [provider]  meloxicam (MOBIC) 7.5 MG tablet Take 7.5 mg by mouth daily. 01/14/21  Yes [provider]  pravastatin (PRAVACHOL) 40 MG tablet Take 40 mg by mouth daily. 01/16/21  Yes [provider]  amLODipine-benazepril (LOTREL) 5-10 MG capsule Take 1 capsule by mouth daily. 01/02/21   [provider]  calcium carbonate (OS-CAL) 600 MG TABS tablet Take by mouth daily.     [provider]  diclofenac Sodium (VOLTAREN) 1 % GEL SMARTSIG:2 Gram(s) Topical 4 Times Daily PRN 02/10/22   [provider]  levothyroxine (SYNTHROID) 100 MCG tablet  04/23/16   [provider]  metFORMIN (GLUCOPHAGE-XR) 500 MG 24 hr tablet SMARTSIG:1 Tablet(s) By Mouth Every Evening 01/03/21   [provider]  traMADol (ULTRAM) 50 MG tablet Take 1 tablet (50 mg total) by mouth every 12 (twelve) hours as needed. 02/22/22   Katha Cabal, DO  vitamin B-12 (CYANOCOBALAMIN) 500 MCG tablet Take by mouth daily.     [provider]    Family History Family History  Problem Relation Age of Onset   Stroke Mother    Heart disease Father     Social History Social History   Tobacco Use   Smoking status: Never   Smokeless tobacco: Never  Vaping Use  Vaping Use: Never used  Substance Use Topics   Alcohol use: No   Drug use: No     Allergies   Benazepril   Review of Systems Review of Systems: :negative unless otherwise stated in HPI.      Physical Exam Triage Vital Signs ED Triage Vitals  Enc Vitals Group     BP 02/22/22 0848 (!) 150/92     Pulse Rate 02/22/22 0848 (!) 51     Resp 02/22/22 0848 14     Temp 02/22/22 0848 98 F (36.7 C)     Temp Source 02/22/22 0848 Oral     SpO2 02/22/22 0848 100 %     Weight 02/22/22 0844 199 lb 15.3 oz (90.7 kg)     Height 02/22/22 0844 5\' 4"  (1.626 m)     Head Circumference --       Peak Flow --      Pain Score 02/22/22 0844 8     Pain Loc --      Pain Edu? --      Excl. in GC? --    No data found.  Updated Vital Signs BP (!) 150/92 (BP Location: Left Arm)   Pulse (!) 51   Temp 98 F (36.7 C) (Oral)   Resp 14   Ht 5\' 4"  (1.626 m)   Wt 90.7 kg   SpO2 100%   BMI 34.32 kg/m   Visual Acuity Right Eye Distance:   Left Eye Distance:   Bilateral Distance:    Right Eye Near:   Left Eye Near:    Bilateral Near:     Physical Exam GEN:     alert, well appearing elderly female in no distress    EYES:   pupils equal and reactive RESP:  no increased work of breathing CVS:   Bradycardic (not new), distal pulses intact  MSK: Shoulder: No evidence of bony deformity, asymmetry or edema; decreased range of motion active/passive  Strength 4+/5 throughout. No abnormal scapular function observed. Sensation intact. Peripheral pulses intact. +AC joint tenderness Knee Exam: Inspection: no deformity, no discoloration. Palpation: medial and lateral joint line tenderness bilaterally. ROM: Limited flexion but seems to have full extension -Special Tests: Varus Stress: Negative; Valgus Stress: Negative; Anterior drawer: Negative; Posterior drawer: Negative;  Thessaly: Not attempted -Limb neurovascularly intact, no instability noted Arthritic changes noted in bilateral hands     UC Treatments / Results  Labs (all labs ordered are listed, but only abnormal results are displayed) Labs Reviewed - No data to display  EKG   Radiology No results found.  Procedures Procedures (including critical care time)  Medications Ordered in UC Medications  ketorolac (TORADOL) injection 30 mg (30 mg Intramuscular Given 02/22/22 0926)    Initial Impression / Assessment and Plan / UC Course  I have reviewed the triage vital signs and the nursing notes.  Pertinent labs & imaging results that were available during my care of the patient were reviewed by me and considered in my medical  decision making (see chart for details).      Pt is a 81 y.o. female with history of osteoarthritis and who presents for acute worsening of her chronic shoulder and knee pain.  Vital signs stable.  Overall patient is well-appearing, well-hydrated and without respiratory distress.  On chart review, she follows with Dr. 02/24/22, orthopedic surgery, and was last seen in May 2023 for suspected right shoulder injury.  She had a MRI of her right shoulder that showed supraspinatus, infraspinatus  and bicep tendon tears.  He had a left shoulder, left and right knee x-rays done in July 2022 that showed osteoarthritis.  She does have diabetes but I do not think this is adhesive capsulitis.  Her last A1c was 6.7. Her pain is beginning to impact her quality of life.  She is not able to stand to cook and enjoy her family as she did before.  He was given a Toradol injection today.  Recent serum creatinine is not elevated.  Advised her to take 2 Tylenol twice a day as needed for pain.  Tramadol prescription provided for severe pain not relieved by Tylenol.  He is to follow-up with Dr. Lenard Forth, her orthopedic provider.  Patient and husband agree with plan.   Final Clinical Impressions(s) / UC Diagnoses   Final diagnoses:  Osteoarthritis of multiple joints, unspecified osteoarthritis type     Discharge Instructions      Take 2 tablets of extra strength Tylenol twice a day.  If you are not getting any relief from this, take a Tramadol tablet.    Call Dr. Rennie Plowman office to see if it is time for you to get another injection.      ED Prescriptions     Medication Sig Dispense Auth. Provider   traMADol (ULTRAM) 50 MG tablet Take 1 tablet (50 mg total) by mouth every 12 (twelve) hours as needed. 10 tablet Katha Cabal, DO      I have reviewed the PDMP during this encounter.   Katha Cabal, DO 02/22/22 1352    Katha Cabal, DO 02/22/22 1352

## 2022-02-22 NOTE — ED Triage Notes (Signed)
Patient c/o bodyaches and muscle pain in her arms and shoulders for a month.  Patient states that she fell over a month ago.  Patient denies any cold symptoms or fever.

## 2022-07-04 ENCOUNTER — Encounter: Payer: Self-pay | Admitting: Emergency Medicine

## 2022-07-04 ENCOUNTER — Ambulatory Visit (INDEPENDENT_AMBULATORY_CARE_PROVIDER_SITE_OTHER): Payer: Medicare PPO

## 2022-07-04 ENCOUNTER — Ambulatory Visit
Admission: EM | Admit: 2022-07-04 | Discharge: 2022-07-04 | Disposition: A | Discharge status: Home / Self Care | Payer: Medicare PPO | Attending: Family Medicine | Admitting: Family Medicine

## 2022-07-04 DIAGNOSIS — M25562 Pain in left knee: Secondary | ICD-10-CM

## 2022-07-04 DIAGNOSIS — W19XXXA Unspecified fall, initial encounter: Secondary | ICD-10-CM | POA: Diagnosis not present

## 2022-07-04 DIAGNOSIS — M25462 Effusion, left knee: Secondary | ICD-10-CM | POA: Diagnosis not present

## 2022-07-04 MED ORDER — TRAMADOL HCL 50 MG PO TABS: 50.0000 mg | ORAL_TABLET | Freq: Two times a day (BID) | ORAL | 0 refills | Status: AC | PRN

## 2022-07-04 NOTE — Discharge Instructions (Signed)
Rest, ice, elevation.  Pain medication as directed.  Follow up with PCP.

## 2022-07-04 NOTE — ED Provider Notes (Signed)
[facility]    CSN: [id number] Arrival date & time: 07/04/22  1007      History   Chief Complaint Chief Complaint  Patient presents with   Fall   Knee Pain    left    HPI 82 year old female with hypertension, hypothyroidism, RA, type 2 diabetes, hyperlipidemia presents for evaluation after suffering a fall.  Patient states that she fell yesterday.  She tripped on a lamp cord.  Larey Seat forward and injured her left knee.  She reports left knee pain and swelling.  Pain is 4/10 in severity.  Also reports some left-sided low back discomfort.  No relieving factors.  No other associated symptoms.  No other complaints.  Past Medical History:  Diagnosis Date   Diabetes mellitus without complication (HCC)    Hyperlipidemia    Hypertension     Patient Active Problem List   Diagnosis Date Noted   Lymphedema 07/26/2016   DJD (degenerative joint disease) 07/26/2016   Diabetes type 2, controlled (HCC) 07/26/2016   Essential hypertension 07/26/2016    Past Surgical History:  Procedure Laterality Date   REPLACEMENT TOTAL KNEE Left     OB History   No obstetric history on file.      Home Medications    Prior to Admission medications   Medication Sig Start Date End Date Taking? Authorizing Provider  acetaminophen (TYLENOL) 500 MG tablet Take 500 mg by mouth every 6 (six) hours as needed.     [provider]  amLODipine-benazepril (LOTREL) 5-10 MG capsule Take 1 capsule by mouth daily. 01/02/21   [provider]  amLODipine-olmesartan (AZOR) 5-40 MG tablet Take 1 tablet by mouth daily. 02/18/22 02/11/23  [provider]  calcium carbonate (OS-CAL) 600 MG TABS tablet Take by mouth daily.     [provider]  diclofenac Sodium (VOLTAREN) 1 % GEL SMARTSIG:2 Gram(s) Topical 4 Times Daily PRN 02/10/22   [provider]  gabapentin (NEURONTIN) 100 MG capsule Take by mouth. 12/24/20   [provider]  levothyroxine (SYNTHROID)  100 MCG tablet  04/23/16   [provider]  levothyroxine (SYNTHROID) 100 MCG tablet Take 1 tablet by mouth daily. 02/13/22 02/13/23  [provider]  meloxicam (MOBIC) 7.5 MG tablet Take 7.5 mg by mouth daily. 01/14/21   [provider]  metFORMIN (GLUCOPHAGE-XR) 500 MG 24 hr tablet SMARTSIG:1 Tablet(s) By Mouth Every Evening 01/03/21   [provider]  pravastatin (PRAVACHOL) 40 MG tablet Take 40 mg by mouth daily. 01/16/21   [provider]  traMADol (ULTRAM) 50 MG tablet Take 1 tablet (50 mg total) by mouth every 12 (twelve) hours as needed for moderate pain or severe pain. 07/04/22   Tommie Sams, DO  vitamin B-12 (CYANOCOBALAMIN) 500 MCG tablet Take by mouth daily.     [provider]    Family History Family History  Problem Relation Age of Onset   Stroke Mother    Heart disease Father     Social History Social History   Tobacco Use   Smoking status: Never   Smokeless tobacco: Never  Vaping Use   Vaping Use: Never used  Substance Use Topics   Alcohol use: No   Drug use: No     Allergies   Benazepril   Review of Systems Review of Systems Per HPI  Physical Exam Triage Vital Signs ED Triage Vitals  Enc Vitals Group     BP 07/04/22 1035 (!) 148/68     Pulse Rate  07/04/22 1035 (!) 50     Resp 07/04/22 1035 14     Temp 07/04/22 1035 98 F (36.7 C)     Temp Source 07/04/22 1035 Oral     SpO2 07/04/22 1035 98 %     Weight 07/04/22 1034 199 lb 15.3 oz (90.7 kg)     Height 07/04/22 1034 5\' 4"  (1.626 m)     Head Circumference --      Peak Flow --      Pain Score 07/04/22 1034 4     Pain Loc --      Pain Edu? --      Excl. in GC? --    No data found.  Updated Vital Signs BP (!) 148/68 (BP Location: Left Arm)   Pulse (!) 50   Temp 98 F (36.7 C) (Oral)   Resp 14   Ht 5\' 4"  (1.626 m)   Wt 90.7 kg   SpO2 98%   BMI 34.32 kg/m   Visual Acuity Right Eye Distance:   Left Eye Distance:   Bilateral Distance:     Right Eye Near:   Left Eye Near:    Bilateral Near:     Physical Exam Vitals and nursing note reviewed.  Constitutional:      General: She is not in acute distress.    Appearance: Normal appearance.  HENT:     Head: Normocephalic and atraumatic.  Pulmonary:     Effort: Pulmonary effort is normal. No respiratory distress.  Musculoskeletal:     Comments: Left knee -bruising noted medially.  Left knee effusion noted.  Neurological:     Mental Status: She is alert.  Psychiatric:        Mood and Affect: Mood normal.        Behavior: Behavior normal.      UC Treatments / Results  Labs (all labs ordered are listed, but only abnormal results are displayed) Labs Reviewed - No data to display  EKG   Radiology DG Knee Complete 4 Views Left  Result Date: 07/04/2022 CLINICAL DATA:  Left knee pain, swelling, and bruising after a fall yesterday. EXAM: LEFT KNEE - COMPLETE 4+ VIEW COMPARISON:  Left knee radiographs 01/24/2021 FINDINGS: Sequelae of total knee arthroplasty are again identified. No acute fracture or dislocation is identified. There is a persistent small knee joint effusion. A superior patellar enthesophyte is unchanged. Moderate soft tissue swelling anterior to the knee is greater than on the prior study. IMPRESSION: Increased soft tissue swelling and persistent small knee joint effusion. No acute fracture. Electronically Signed   By: 09/02/2022 M.D.   On: 07/04/2022 11:29    Procedures Procedures (including critical care time)  Medications Ordered in UC Medications - No data to display  Initial Impression / Assessment and Plan / UC Course  I have reviewed the triage vital signs and the nursing notes.  Pertinent labs & imaging results that were available during my care of the patient were reviewed by me and considered in my medical decision making (see chart for details).    82 year old female presents with an injury to the left knee.  X-ray was obtained and was  independently reviewed by me.  Effusion noted.  No fracture.  Not proceeding with NSAIDs given advanced age and underlying anemia.  Tramadol as directed.  Advise rest, ice, elevation.  Final Clinical Impressions(s) / UC Diagnoses   Final diagnoses:  Effusion of left knee     Discharge Instructions  Rest, ice, elevation.  Pain medication as directed.  Follow up with PCP.     ED Prescriptions     Medication Sig Dispense Auth. Provider   traMADol (ULTRAM) 50 MG tablet Take 1 tablet (50 mg total) by mouth every 12 (twelve) hours as needed for moderate pain or severe pain. 10 tablet Thersa Salt G, DO      I have reviewed the PDMP during this encounter.   Coral Spikes, [place] 07/04/22 1141

## 2022-07-04 NOTE — ED Triage Notes (Signed)
Patient states that she tripped and fell over a cord on the floor yesterday.  Patient c/o left knee pain.

## 2022-08-08 ENCOUNTER — Ambulatory Visit
Admission: EM | Admit: 2022-08-08 | Discharge: 2022-08-08 | Disposition: A | Discharge status: Home / Self Care | Payer: Medicare PPO | Attending: Emergency Medicine | Admitting: Emergency Medicine

## 2022-08-08 DIAGNOSIS — M545 Low back pain, unspecified: Secondary | ICD-10-CM | POA: Diagnosis not present

## 2022-08-08 MED ORDER — BACLOFEN 10 MG PO TABS: 10.0000 mg | ORAL_TABLET | Freq: Three times a day (TID) | ORAL | 0 refills | Status: DC

## 2022-08-08 MED ORDER — IBUPROFEN 600 MG PO TABS: 600.0000 mg | ORAL_TABLET | Freq: Four times a day (QID) | ORAL | 0 refills | Status: DC | PRN

## 2022-08-08 NOTE — ED Triage Notes (Signed)
Patient presents with back pain x a week.   Patient states no changes in urination. Declines any falls.

## 2022-08-08 NOTE — Discharge Instructions (Signed)

## 2022-08-08 NOTE — ED Provider Notes (Signed)
[facility]    CSN: [id number] Arrival date & time: 08/08/22  0820      History   Chief Complaint Chief Complaint  Patient presents with   Back Pain    HPI [name] is a 82 y.o. female.   HPI  82 year old female here for evaluation of low back pain.  The patient reports that she has been experiencing pain in her low back for the past week.  She denies any radiation to her legs, numbness or tingling in her lower extremities, weakness of lower extremities, or saddle anesthesia.  She denies any falls or lifting.  Coralyn Mark and her job.  Past Medical History:  Diagnosis Date   Diabetes mellitus without complication ([place])    Hyperlipidemia    Hypertension     Patient Active Problem List   Diagnosis Date Noted   Lymphedema 07/26/2016   DJD (degenerative joint disease) 07/26/2016   Diabetes type 2, controlled ([place]) 07/26/2016   Essential hypertension 07/26/2016    Past Surgical History:  Procedure Laterality Date   REPLACEMENT TOTAL KNEE Left     OB History   No obstetric history on file.      Home Medications    Prior to Admission medications   Medication Sig Start Date End Date Taking? Authorizing Provider  baclofen (LIORESAL) 10 MG tablet Take 1 tablet (10 mg total) by mouth 3 (three) times daily. 08/08/22  Yes Margarette Canada, NP  ibuprofen (ADVIL) 600 MG tablet Take 1 tablet (600 mg total) by mouth every 6 (six) hours as needed. 08/08/22  Yes Margarette Canada, NP  acetaminophen (TYLENOL) 500 MG tablet Take 500 mg by mouth every 6 (six) hours as needed.     [provider]  amLODipine-benazepril (LOTREL) 5-10 MG capsule Take 1 capsule by mouth daily. 01/02/21   [provider]  amLODipine-olmesartan (AZOR) 5-40 MG tablet Take 1 tablet by mouth daily. 02/18/22 02/11/23  [provider]  calcium carbonate (OS-CAL) 600 MG TABS tablet Take by mouth daily.     [provider]  diclofenac Sodium (VOLTAREN) 1 % GEL SMARTSIG:2  Gram(s) Topical 4 Times Daily PRN 02/10/22   [provider]  gabapentin (NEURONTIN) 100 MG capsule Take by mouth. 12/24/20   [provider]  levothyroxine (SYNTHROID) 100 MCG tablet  04/23/16   [provider]  levothyroxine (SYNTHROID) 100 MCG tablet Take 1 tablet by mouth daily. 02/13/22 02/13/23  [provider]  metFORMIN (GLUCOPHAGE-XR) 500 MG 24 hr tablet SMARTSIG:1 Tablet(s) By Mouth Every Evening 01/03/21   [provider]  pravastatin (PRAVACHOL) 40 MG tablet Take 40 mg by mouth daily. 01/16/21   [provider]  traMADol (ULTRAM) 50 MG tablet Take 1 tablet (50 mg total) by mouth every 12 (twelve) hours as needed for moderate pain or severe pain. 07/04/22   Coral Spikes, DO  vitamin B-12 (CYANOCOBALAMIN) 500 MCG tablet Take by mouth daily.     [provider]    Family History Family History  Problem Relation Age of Onset   Stroke Mother    Heart disease Father     Social History Social History   Tobacco Use   Smoking status: Never   Smokeless tobacco: Never  Vaping Use   Vaping Use: Never used  Substance Use Topics   Alcohol use: No   Drug use: No     Allergies   Benazepril   Review of Systems Review of Systems  Musculoskeletal:  Positive for back pain.  Negative for gait problem.  Neurological:  Negative for weakness and numbness.  Hematological: Negative.   Psychiatric/Behavioral: Negative.       Physical Exam Triage Vital Signs ED Triage Vitals  Enc Vitals Group     BP 08/08/22 0847 130/72     Pulse Rate 08/08/22 0847 68     Resp 08/08/22 0847 18     Temp 08/08/22 0847 (!) 97.3 F (36.3 C)     Temp Source 08/08/22 0847 Oral     SpO2 08/08/22 0847 100 %     Weight 08/08/22 0845 194 lb (88 kg)     Height 08/08/22 0845 5' 4"$  (1.626 m)     Head Circumference --      Peak Flow --      Pain Score 08/08/22 0845 10     Pain Loc --      Pain Edu? --      Excl. in [place]? --    No data  found.  Updated Vital Signs BP 130/72 (BP Location: Left Arm)   Pulse 68   Temp (!) 97.3 F (36.3 C) (Oral)   Resp 18   Ht 5' 4"$  (1.626 m)   Wt 194 lb (88 kg)   SpO2 100%   BMI 33.30 kg/m   Visual Acuity Right Eye Distance:   Left Eye Distance:   Bilateral Distance:    Right Eye Near:   Left Eye Near:    Bilateral Near:     Physical Exam Vitals and nursing note reviewed.  Constitutional:      Appearance: Normal appearance. She is not ill-appearing.  Musculoskeletal:        General: Tenderness present. No swelling, deformity or signs of injury.  Skin:    General: Skin is warm and dry.     Capillary Refill: Capillary refill takes less than 2 seconds.  Neurological:     General: No focal deficit present.     Mental Status: She is alert and oriented to person, place, and time.     Sensory: No sensory deficit.     Motor: No weakness.     Deep Tendon Reflexes: Reflexes normal.      UC Treatments / Results  Labs (all labs ordered are listed, but only abnormal results are displayed) Labs Reviewed - No data to display  EKG   Radiology No results found.  Procedures Procedures (including critical care time)  Medications Ordered in UC Medications - No data to display  Initial Impression / Assessment and Plan / UC Course  I have reviewed the triage vital signs and the nursing notes.  Pertinent labs & imaging results that were available during my care of the patient were reviewed by me and considered in my medical decision making (see chart for details).   Patient is a very pleasant, nontoxic-appearing 23-year-old female here for evaluation of bilateral lumbar back pain.  There is no associated radiation to her lower extremities or saddle anesthesia.  No falls or injuries that she can think of.  She denies any heavy lifting.  There is no midline spinous process tenderness but there is tenderness and mild spasm to bilateral lower lumbar paraspinous muscle tissue.  Her  lower extremities Exhibit 5/5 strength bilaterally and her DTRs in her lower extremities are 2+ bilaterally.  I will treat her for spasms in her low back with 600 mg of ibuprofen every 6 hours with food and 10 mg of baclofen every 8 hours.  I have also discussed  using moist heat and home physical therapy that I want her to start tomorrow.  CMP from [facility] on 02/10/2022 shows a BUN of 13 and a creatinine of 0.54.  I do not will use steroids as patient is a diabetic.  Return precautions reviewed.   Final Clinical Impressions(s) / UC Diagnoses   Final diagnoses:  Acute bilateral low back pain without sciatica     Discharge Instructions      Take the ibuprofen, 600 mg every 6 hours with food, on a schedule for the next 48 hours and then as needed.  Take the baclofen, 10 mg every 8 hours, on a schedule for the next 48 hours and then as needed.  Apply moist heat to your back for 30 minutes at a time 2-3 times a day to improve blood flow to the area and help remove the lactic acid causing the spasm.  Follow the back exercises given at discharge.  Return for reevaluation for any new or worsening symptoms.      ED Prescriptions     Medication Sig Dispense Auth. Provider   ibuprofen (ADVIL) 600 MG tablet Take 1 tablet (600 mg total) by mouth every 6 (six) hours as needed. 30 tablet Margarette Canada, NP   baclofen (LIORESAL) 10 MG tablet Take 1 tablet (10 mg total) by mouth 3 (three) times daily. 70 each Margarette Canada, NP      PDMP not reviewed this encounter.   Margarette Canada, NP 08/08/22 [phone]

## 2022-08-24 NOTE — Progress Notes (Deleted)
Referring Physician:  Sol Passer, MD [address] [place] [address] [place],  [place] [address]  Primary Physician:  Sol Passer, MD  History of Present Illness: 08/24/2022*** Ms. [name] has a history of DM, HTN, lymphedema, and hyperlipidemia.   She was seen in ED on 08/08/22 with a week history of LBP. No leg pain.     Given motrin and baclofen from ED.   Duration: *** Location: *** Quality: *** Severity: ***  Precipitating: aggravated by *** Modifying factors: made better by *** Weakness: none Timing: *** Bowel/Bladder Dysfunction: none  Conservative measures:  Physical therapy: ***  Multimodal medical therapy including regular antiinflammatories: motrin, baclofen, tylenol, voltaren gel, neurontin, ultram  Injections: *** epidural steroid injections  Past Surgery: ***  Robbin Salas has ***no symptoms of cervical myelopathy.  The symptoms are causing a significant impact on the patient's life.   Review of Systems:  A 10 point review of systems is negative, except for the pertinent positives and negatives detailed in the HPI.  Past Medical History: Past Medical History:  Diagnosis Date   Diabetes mellitus without complication ([place])    Hyperlipidemia    Hypertension     Past Surgical History: Past Surgical History:  Procedure Laterality Date   REPLACEMENT TOTAL KNEE Left     Allergies: Allergies as of 08/28/2022 - Review Complete 08/08/2022  Allergen Reaction Noted   Benazepril Swelling 10/15/2021    Medications: Outpatient Encounter Medications as of 08/28/2022  Medication Sig   acetaminophen (TYLENOL) 500 MG tablet Take 500 mg by mouth every 6 (six) hours as needed.    amLODipine-benazepril (LOTREL) 5-10 MG capsule Take 1 capsule by mouth daily.   amLODipine-olmesartan (AZOR) 5-40 MG tablet Take 1 tablet by mouth daily.   baclofen (LIORESAL) 10 MG tablet Take 1 tablet (10 mg total) by mouth 3 (three) times daily.   calcium  carbonate (OS-CAL) 600 MG TABS tablet Take by mouth daily.    diclofenac Sodium (VOLTAREN) 1 % GEL SMARTSIG:2 Gram(s) Topical 4 Times Daily PRN   gabapentin (NEURONTIN) 100 MG capsule Take by mouth.   ibuprofen (ADVIL) 600 MG tablet Take 1 tablet (600 mg total) by mouth every 6 (six) hours as needed.   levothyroxine (SYNTHROID) 100 MCG tablet    levothyroxine (SYNTHROID) 100 MCG tablet Take 1 tablet by mouth daily.   metFORMIN (GLUCOPHAGE-XR) 500 MG 24 hr tablet SMARTSIG:1 Tablet(s) By Mouth Every Evening   pravastatin (PRAVACHOL) 40 MG tablet Take 40 mg by mouth daily.   traMADol (ULTRAM) 50 MG tablet Take 1 tablet (50 mg total) by mouth every 12 (twelve) hours as needed for moderate pain or severe pain.   vitamin B-12 (CYANOCOBALAMIN) 500 MCG tablet Take by mouth daily.    No facility-administered encounter medications on file as of 08/28/2022.    Social History: Social History   Tobacco Use   Smoking status: Never   Smokeless tobacco: Never  Vaping Use   Vaping Use: Never used  Substance Use Topics   Alcohol use: No   Drug use: No    Family Medical History: Family History  Problem Relation Age of Onset   Stroke Mother    Heart disease Father     Physical Examination: There were no vitals filed for this visit.  General: Patient is well developed, well nourished, calm, collected, and in no apparent distress. Attention to examination is appropriate.  Respiratory: Patient is breathing without any difficulty.   NEUROLOGICAL:     Awake,  alert, oriented to person, place, and time.  Speech is clear and fluent. Fund of knowledge is appropriate.   Cranial Nerves: Pupils equal round and reactive to light.  Facial tone is symmetric.    *** ROM of cervical spine *** pain *** posterior cervical tenderness. *** tenderness in bilateral trapezial region.   *** ROM of lumbar spine *** pain *** posterior lumbar tenderness.   No abnormal lesions on exposed skin.   Strength: Side  Biceps Triceps Deltoid Interossei Grip Wrist Ext. Wrist Flex.  R '5 5 5 5 5 5 5  '$ L '5 5 5 5 5 5 5   '$ Side Iliopsoas Quads Hamstring PF DF EHL  R '5 5 5 5 5 5  '$ L '5 5 5 5 5 5   '$ Reflexes are ***2+ and symmetric at the biceps, triceps, brachioradialis, patella and achilles.   Hoffman's is absent.  Clonus is not present.   Bilateral upper and lower extremity sensation is intact to light touch.     Gait is normal.   ***No difficulty with tandem gait.    Medical Decision Making  Imaging: none  Assessment and Plan: Ms. Kary is a pleasant 82 y.o. female has ***  Treatment options discussed with patient and following plan made:   - Order for physical therapy for *** spine ***. Patient to call to schedule appointment. *** - Continue current medications including ***. Reviewed dosing and side effects.  - Prescription for ***. Reviewed dosing and side effects. Take with food.  - Prescription for *** to take prn muscle spasms. Reviewed dosing and side effects. Discussed this can cause drowsiness.  - MRI of *** to further evaluate *** radiculopathy. No improvement time or medications (***).  - Referral to PMR at [facility] to discuss possible *** injections.  - Will schedule phone visit to review MRI results once I get them back.   I spent a total of *** minutes in face-to-face and non-face-to-face activities related to this patient's care today including review of outside records, review of imaging, review of symptoms, physical exam, discussion of differential diagnosis, discussion of treatment options, and documentation.   Thank you for involving me in the care of this patient.   Geronimo Boot PA-C Dept. of Neurosurgery

## 2022-08-25 NOTE — Progress Notes (Signed)
Referring Physician:  Margarette Canada, NP [address] [place],  [place] [address]  Primary Physician:  Sol Passer, MD  History of Present Illness: 08/31/2022 Ms. Demri [name] has a history of DM, HTN, lymphedema, and hyperlipidemia.   She was seen in ED on 08/08/22 with a week history of LBP. No leg pain.   3 month history of constant LBP with no leg pain that is worse with standing and walking. She has pain at night. Some relief with heating pad. Pain is sharp and stabbing. No numbness, tingling, or weakness in her legs.   Given motrin and baclofen from ED. These did not help.   Bowel/Bladder Dysfunction: none  Conservative measures:  Physical therapy: no Multimodal medical therapy including regular antiinflammatories: motrin, baclofen, tylenol, voltaren gel, neurontin, ultram, Tylenol Injections: No steroid injections  Past Surgery: no spinal surgery.  Bronda Mizer has no symptoms of cervical myelopathy.  The symptoms are causing a significant impact on the patient's life.   Review of Systems:  A 10 point review of systems is negative, except for the pertinent positives and negatives detailed in the HPI.  Past Medical History: Past Medical History:  Diagnosis Date   Diabetes mellitus without complication ([place])    Hyperlipidemia    Hypertension     Past Surgical History: Past Surgical History:  Procedure Laterality Date   REPLACEMENT TOTAL KNEE Left     Allergies: Allergies as of 08/31/2022 - Review Complete 08/31/2022  Allergen Reaction Noted   Benazepril Swelling 10/15/2021    Medications: Outpatient Encounter Medications as of 08/31/2022  Medication Sig   acetaminophen (TYLENOL) 500 MG tablet Take 500 mg by mouth every 6 (six) hours as needed.    amLODipine-benazepril (LOTREL) 5-10 MG capsule Take 1 capsule by mouth daily.   amLODipine-olmesartan (AZOR) 5-40 MG tablet Take 1 tablet by mouth daily.   calcium carbonate (OS-CAL) 600  MG TABS tablet Take by mouth daily.    diclofenac Sodium (VOLTAREN) 1 % GEL SMARTSIG:2 Gram(s) Topical 4 Times Daily PRN   gabapentin (NEURONTIN) 100 MG capsule Take by mouth.   levothyroxine (SYNTHROID) 100 MCG tablet    levothyroxine (SYNTHROID) 100 MCG tablet Take 1 tablet by mouth daily.   metFORMIN (GLUCOPHAGE-XR) 500 MG 24 hr tablet SMARTSIG:1 Tablet(s) By Mouth Every Evening   pravastatin (PRAVACHOL) 40 MG tablet Take 40 mg by mouth daily.   traMADol (ULTRAM) 50 MG tablet Take 1 tablet (50 mg total) by mouth every 12 (twelve) hours as needed for moderate pain or severe pain.   vitamin B-12 (CYANOCOBALAMIN) 500 MCG tablet Take by mouth daily.    [DISCONTINUED] baclofen (LIORESAL) 10 MG tablet Take 1 tablet (10 mg total) by mouth 3 (three) times daily.   [DISCONTINUED] ibuprofen (ADVIL) 600 MG tablet Take 1 tablet (600 mg total) by mouth every 6 (six) hours as needed.   No facility-administered encounter medications on file as of 08/31/2022.    Social History: Social History   Tobacco Use   Smoking status: Never   Smokeless tobacco: Never  Vaping Use   Vaping Use: Never used  Substance Use Topics   Alcohol use: No   Drug use: No    Family Medical History: Family History  Problem Relation Age of Onset   Stroke Mother    Heart disease Father     Physical Examination: Vitals:   08/31/22 0900  BP: (!) 140/76    General: Patient is well developed, well nourished, calm, collected, and in  no apparent distress. Attention to examination is appropriate.  Respiratory: Patient is breathing without any difficulty.   NEUROLOGICAL:     Awake, alert, oriented to person, place, and time.  Speech is clear and fluent. Fund of knowledge is appropriate.   Cranial Nerves: Pupils equal round and reactive to light.  Facial tone is symmetric.    Reasonable ROM of lumbar spine with pain on flexion Mild diffuse posterior lumbar tenderness.   No abnormal lesions on exposed skin.    Strength: Side Biceps Triceps Deltoid Interossei Grip Wrist Ext. Wrist Flex.  R '5 5 5 5 5 5 5  '$ L '5 5 5 5 5 5 5   '$ Side Iliopsoas Quads Hamstring PF DF EHL  R '5 5 5 5 5 5  '$ L '5 5 5 5 5 5   '$ Reflexes are 2+ and symmetric at the biceps, triceps, brachioradialis, patella and achilles.   Hoffman's is absent.  Clonus is not present.   Bilateral upper and lower extremity sensation is intact to light touch.     Gait is normal.     Medical Decision Making  Imaging: none  Assessment and Plan: Ms. Sawhney is a pleasant 82 y.o. female has 3 month history of constant LBP with no leg pain that is worse with standing and walking. No numbness, tingling, or weakness in her legs.   No lumbar imaging done. Pain appears to be lumbar mediated.   Treatment options discussed with patient and following plan made:   - Lumbar xrays ordered. Will do phone visit to review them with her once I have results back.  - Order for physical therapy for lumbar spine to [facility] in [place].  - Prescription for mobic. Reviewed dosing and side effects. Take with food.  - Follow up in 6-8 weeks for recheck. Consider MRI of no improvement with above.   I spent a total of 25 minutes in face-to-face and non-face-to-face activities related to this patient's care today including review of outside records, review of imaging, review of symptoms, physical exam, discussion of differential diagnosis, discussion of treatment options, and documentation.   Thank you for involving me in the care of this patient.   Geronimo Boot PA-C Dept. of Neurosurgery

## 2022-08-28 ENCOUNTER — Ambulatory Visit: Payer: Medicare Other | Admitting: Orthopedic Surgery

## 2022-08-31 ENCOUNTER — Ambulatory Visit: Payer: Medicare PPO | Admitting: Orthopedic Surgery

## 2022-08-31 ENCOUNTER — Ambulatory Visit
Admission: RE | Admit: 2022-08-31 | Discharge: 2022-08-31 | Disposition: A | Discharge status: Home / Self Care | Payer: Medicare PPO | Source: Ambulatory Visit | Attending: Orthopedic Surgery | Admitting: Orthopedic Surgery

## 2022-08-31 ENCOUNTER — Ambulatory Visit
Admission: RE | Admit: 2022-08-31 | Discharge: 2022-08-31 | Disposition: A | Discharge status: Home / Self Care | Payer: Medicare PPO | Attending: Orthopedic Surgery | Admitting: Orthopedic Surgery

## 2022-08-31 VITALS — BP 140/76 | Ht 64.0 in | Wt 198.0 lb

## 2022-08-31 DIAGNOSIS — M545 Low back pain, unspecified: Secondary | ICD-10-CM

## 2022-08-31 MED ORDER — MELOXICAM 15 MG PO TABS: 15.0000 mg | ORAL_TABLET | Freq: Every day | ORAL | 2 refills | Status: AC

## 2022-08-31 NOTE — Patient Instructions (Signed)
It was so nice to see you today. Thank you so much for coming in.    I ordered xrays of  your lower back. You can go across the street to get these at [facility] (building with the white pillars). You do not need any appointment. We will set up a phone visit to review the results once I get them back.   I sent physical therapy orders to [facility] in [place]. You can call them at [phone] if you don't hear from them to schedule your visit.   I sent a prescription for meloxicam to help with pain and inflammation. Take as directed with food. Stop tylenol when taking this. Do not use diclofenac gel when using the meloxicam.   I will see you back in 6-8 weeks. Please do not hesitate to call if you have any questions or concerns. You can also message me in [place].   Geronimo Boot PA-C [phone]

## 2022-09-03 ENCOUNTER — Encounter: Payer: Self-pay | Admitting: Orthopedic Surgery

## 2022-09-03 ENCOUNTER — Ambulatory Visit (INDEPENDENT_AMBULATORY_CARE_PROVIDER_SITE_OTHER): Payer: Medicare PPO | Admitting: Orthopedic Surgery

## 2022-09-03 DIAGNOSIS — M4316 Spondylolisthesis, lumbar region: Secondary | ICD-10-CM | POA: Diagnosis not present

## 2022-09-03 DIAGNOSIS — M5136 Other intervertebral disc degeneration, lumbar region: Secondary | ICD-10-CM

## 2022-09-03 DIAGNOSIS — Z8781 Personal history of (healed) traumatic fracture: Secondary | ICD-10-CM

## 2022-09-03 DIAGNOSIS — M47816 Spondylosis without myelopathy or radiculopathy, lumbar region: Secondary | ICD-10-CM | POA: Diagnosis not present

## 2022-09-03 NOTE — Progress Notes (Signed)
   Telephone Visit- Progress Note: Referring Physician:  No referring provider defined for this encounter.  Primary Physician:  Sol Passer, MD  This visit was performed via telephone.  Patient location: home Provider location: office  I spent a total of 10 minutes non-face-to-face activities for this visit on the date of this encounter including review of current clinical condition and response to treatment.    Patient has given verbal consent to this telephone visits and we reviewed the limitations of a telephone visit. Patient wishes to proceed.    Chief Complaint:  review xrays  History of Present Illness: Celida Hoefling is a 82 y.o. female has a history of DM, HTN, lymphedema, and hyperlipidemia.    Phone visit to review her lumbar xrays.   She continues with constant LBP with no leg pain. Worse with standing and walking.   She has started mobic that I gave her on Monday. Still having some pain.    Exam: No exam done as this was a telephone encounter.     Imaging: Lumbar spine xrays dated 08/31/22:  FINDINGS: Mild anterior wedge compression deformity at the L1 level. No motion with flexion and extension to suggest instability. No osteolytic or osteoblastic lesions identified. No osteolytic or osteoblastic changes.   Degenerative disc disease noted with disc space narrowing and marginal osteophytes at L3-4 through L5-S1.   IMPRESSION: Degenerative changes. Age-indeterminate L1 compression fracture. The acuity of this finding could be assessed with an MRI.     Electronically Signed   By: Sammie Bench M.D.   On: 08/31/2022 23:41  I have personally reviewed the images and agree with the above interpretation.  She also has slip at L4-L5.   Assessment and Plan: Ms. Jochem is a pleasant 82 y.o. female with 3 month history of constant LBP with no leg pain that is worse with standing and walking. No numbness, tingling, or weakness in her legs.     She has known lumbar spondylosis/DDD L3-S1 with slip L4-L5. Age indeterminate compression fracture L1.    Treatment options discussed with patient and following plan made:   - Reviewed xrays at length with patient and her husband. Do not think L1 compression fracture is acute as she had no point tenderness on exam and pain does not correlate with an acute fracture. Pain likely is due to underlying wear and tear.  - Continue with PT that was ordered at last visit.  - Continue on mobic. Take with food. Can take prn tylenol for break through pain if needed.  - Follow up with me as scheduled in 6 weeks. Consider MRI of no improvement with above.   Geronimo Boot PA-C Neurosurgery

## 2022-09-30 ENCOUNTER — Telehealth: Payer: Self-pay | Admitting: Orthopedic Surgery

## 2022-09-30 DIAGNOSIS — M545 Low back pain, unspecified: Secondary | ICD-10-CM

## 2022-09-30 NOTE — Telephone Encounter (Signed)
Patient

## 2022-09-30 NOTE — Telephone Encounter (Signed)
Referral has been printed and faxed

## 2022-09-30 NOTE — Telephone Encounter (Signed)
Patient is calling that [facility] told her they would need a new referral for PT since her last referral will expire by the time she is seen.

## 2022-09-30 NOTE — Telephone Encounter (Signed)
New referral for PT has been placed. Please send this over.

## 2022-10-19 ENCOUNTER — Ambulatory Visit: Payer: Medicare PPO | Admitting: Orthopedic Surgery

## 2022-11-09 ENCOUNTER — Encounter: Payer: Medicare PPO | Attending: Physician Assistant | Admitting: Physician Assistant

## 2022-11-09 DIAGNOSIS — E11622 Type 2 diabetes mellitus with other skin ulcer: Secondary | ICD-10-CM | POA: Diagnosis present

## 2022-11-09 DIAGNOSIS — I87331 Chronic venous hypertension (idiopathic) with ulcer and inflammation of right lower extremity: Secondary | ICD-10-CM | POA: Diagnosis not present

## 2022-11-09 DIAGNOSIS — I10 Essential (primary) hypertension: Secondary | ICD-10-CM | POA: Insufficient documentation

## 2022-11-09 DIAGNOSIS — L97812 Non-pressure chronic ulcer of other part of right lower leg with fat layer exposed: Secondary | ICD-10-CM | POA: Diagnosis not present

## 2022-11-12 DIAGNOSIS — E11622 Type 2 diabetes mellitus with other skin ulcer: Secondary | ICD-10-CM | POA: Diagnosis not present

## 2022-11-13 NOTE — Progress Notes (Signed)
Kristie, [name] ([id number]) [id number] Page 1 of 2 Visit Report for 11/12/2022 Physician Orders Details Patient Name: Date of Service: [name], [name] 11/12/2022 2:00 PM Medical Record Number: [id number] Patient Account Number: [id number] Date of Birth/Sex: Treating RN: 1940-07-30 (82 y.o. [name] Primary Care Provider: Darnelle Spangle Other Clinician: Referring Provider: Treating Provider/Extender: Paulina Fusi Weeks in Treatment: 0 Verbal / Phone Orders: No Diagnosis Coding Follow-up Appointments Return Appointment in 1 week. Bathing/ Shower/ Hygiene May shower with wound dressing protected with water repellent cover or cast protector. No tub bath. Anesthetic (Use 'Patient Medications' Section for Anesthetic Order Entry) Lidocaine applied to wound bed Edema Control - Lymphedema / Segmental Compressive Device / Other Elevate, Exercise Daily and A void Standing for Long Periods of Time. Elevate legs to the level of the heart and pump ankles as often as possible Elevate leg(s) parallel to the floor when sitting. Medications-Please add to medication list. ntibiotics - continue taking PO antibiotic doxy until finished P.O. A Other: - LEAVE CURRENT DRESSING INPLACE. ONLY REMOVE IF THE DRESSING BECOMES WET OR DIRTY. INSTRUCTIONS: ONLY NEEDED IF THERE IS AN ISSUE WITH CURRENT DRESSING: for hydrofera blue rope, if needed use cut pieces sent home with, I used two smaller pieces, leaving a tail at the end for removal. Remove old blue rope cleanse wound and then re insert blue rope into tunneled areas. T wound tunnels down and lower wound tunnels up. When inserting place in one motion because when blue hits moisture it starts to op expand making it harder to insert. You may place a drop of saline at end of rope, after insertion so it does not cause discomfort. Then place outer dressing provided to cover. Wound Treatment Wound #1 -  Lower Leg Wound Laterality: Right, Medial Cleanser: Byram Ancillary Kit - 15 Day Supply (Generic) 2 x Per Week/30 Days Discharge Instructions: Use supplies as instructed; Kit contains: (15) Saline Bullets; (15) 3x3 Gauze; 15 pr Gloves Cleanser: Soap and Water 2 x Per Week/30 Days Discharge Instructions: Gently cleanse wound with antibacterial soap, rinse and pat dry prior to dressing wounds Cleanser: Wound Cleanser (Generic) 2 x Per Week/30 Days Discharge Instructions: Wash your hands with soap and water. Remove old dressing, discard into plastic bag and place into trash. Cleanse the wound with Wound Cleanser prior to applying a clean dressing using gauze sponges, not tissues or cotton balls. Do not scrub or use excessive force. Pat dry using gauze sponges, not tissue or cotton balls. Prim Dressing: Hydrofera Blue Classic Foam Rope Dressing, 9x6 (mm/in) 2 x Per Week/30 Days ary Discharge Instructions: tail left for removal. two pieces used on for top wound and one for lower wound Secondary Dressing: (BORDER) Zetuvit Plus SILICONE BORDER Dressing 5x5 (in/in) (Generic) 2 x Per Week/30 Days Discharge Instructions: Please do not put silicone bordered dressings under wraps. Use non-bordered dressing only. Electronic Signature(s) Signed: 11/12/2022 4:11:08 PM By: Angelina Pih Signed: 11/12/2022 6:23:48 PM By: Allen Derry PA-C Entered By: Angelina Pih on 11/12/2022 14:26:41 -------------------------------------------------------------------------------- SuperBill Details Patient Name: Date of Service: [name], [name] 11/12/2022 Medical Record Number: [id number] Patient Account Number: [id number] Date of Birth/Sex: Treating RN: 1940-12-10 (82 y.o. 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

## 2022-11-13 NOTE — Progress Notes (Signed)
SHAIAN, KOCHIS ([id number]) [id number] Page 1 of 4 Visit Report for 11/09/2022 Abuse Risk Screen Details Patient Name: Date of Service: [name], [name] 11/09/2022 9:30 A M Medical Record Number: [id number] Patient Account Number: [id number] Date of Birth/Sex: Treating RN: [birth year] (82 y.o. Freddy Finner Primary Care Veleka Djordjevic: Darnelle Spangle Other Clinician: Referring Tiffnay Bossi: Treating Cheynne Virden/Extender: Paulina Fusi Weeks in Treatment: 0 Abuse Risk Screen Items Answer ABUSE RISK SCREEN: Has anyone close to you tried to hurt or harm you recentlyo No Do you feel uncomfortable with anyone in your familyo No Has anyone forced you do things that you didnt want to doo No Electronic Signature(s) Signed: 11/13/2022 7:57:59 AM By: Yevonne Pax RN Entered By: Yevonne Pax on 11/09/2022 09:27:49 -------------------------------------------------------------------------------- Activities of Daily Living Details Patient Name: Date of Service: [name], [name] 11/09/2022 9:30 A M Medical Record Number: [id number] Patient Account Number: [id number] Date of Birth/Sex: Treating RN: 31-Aug-1940 (82 y.o. Freddy Finner Primary Care Raul Winterhalter: Darnelle Spangle Other Clinician: Referring Iasha Mccalister: Treating Jerlisa Diliberto/Extender: Paulina Fusi Weeks in Treatment: 0 Activities of Daily Living Items Answer Activities of Daily Living (Please select one for each item) Drive Automobile Completely Able T Medications ake Completely Able Use T elephone Completely Able Care for Appearance Completely Able Use T oilet Completely Able Bath / Shower Completely Able Dress Self Completely Able Feed Self Completely Able Walk Completely Able Get In / Out Bed Completely Able Housework Completely Able Prepare Meals Completely Able Handle Money Completely Able Shop for Self Completely Able Electronic Signature(s) Signed: 11/13/2022 7:57:59 AM By:  Yevonne Pax RN Entered By: Yevonne Pax on 11/09/2022 09:28:11 -------------------------------------------------------------------------------- Education Screening Details Patient Name: Date of Service: [name], [name] 11/09/2022 9:30 A M Medical Record Number: [id number] Patient Account Number: [id number] Date of Birth/Sex: Treating RN: 1940/08/15 (82 y.o. Freddy Finner Primary Care Kaimana Neuzil: Darnelle Spangle Other Clinician: Referring Henreitta Spittler: Treating Mardie Kellen/Extender: Paulina Fusi Weeks in Treatment: 0 [place], [name] ([id number]) [id number] Page 2 of 4 Primary Learner Assessed: Patient Learning Preferences/Education Level/Primary Language Learning Preference: Explanation Highest Education Level: High School Preferred Language: English Cognitive Barrier Language Barrier: No Translator Needed: No Memory Deficit: No Emotional Barrier: No Cultural/Religious Beliefs Affecting Medical Care: No Physical Barrier Impaired Vision: No Impaired Hearing: No Decreased Hand dexterity: No Knowledge/Comprehension Knowledge Level: Medium Comprehension Level: Medium Ability to understand written instructions: High Ability to understand verbal instructions: High Motivation Anxiety Level: Anxious Cooperation: Cooperative Education Importance: Acknowledges Need Interest in Health Problems: Asks Questions Perception: Coherent Willingness to Engage in Self-Management High Activities: Readiness to Engage in Self-Management High Activities: Electronic Signature(s) Signed: 11/13/2022 7:57:59 AM By: Yevonne Pax RN Entered By: Yevonne Pax on 11/09/2022 09:28:54 -------------------------------------------------------------------------------- Fall Risk Assessment Details Patient Name: Date of Service: [name], [name] 11/09/2022 9:30 A M Medical Record Number: [id number] Patient Account Number: [id number] Date of Birth/Sex: Treating  RN: 11-12-1940 (82 y.o. Freddy Finner Primary Care Angello Chien: Darnelle Spangle Other Clinician: Referring Ehtan Delfavero: Treating Ilyaas Musto/Extender: Paulina Fusi Weeks in Treatment: 0 Fall Risk Assessment Items Have you had 2 or more falls in the last 12 monthso 0 Yes Have you had any fall that resulted in injury in the last 12 monthso 0 Yes FALLS RISK SCREEN History of falling - immediate or within 3 months 25 Yes Secondary diagnosis (Do you have 2 or more medical diagnoseso) 0 No Ambulatory aid None/bed rest/wheelchair/nurse 0 Yes Crutches/cane/walker 0 No Furniture 0 No Intravenous therapy Access/Saline/Heparin Lock 0 No Gait/Transferring Normal/ bed  rest/ wheelchair 0 Yes Weak (short steps with or without shuffle, stooped but able to lift head while walking, may seek 0 No support from furniture) Impaired (short steps with shuffle, may have difficulty arising from chair, head down, impaired 0 No balance) Mental Status Oriented to own ability 0 Yes Overestimates or forgets limitations 0 No Risk Level: Medium Risk Score: 25 Mcmullen, Louann ([id number]) [phone] [id number] Page 3 of 4 Electronic Signature(s) -------------------------------------------------------------------------------- Foot Assessment Details Patient Name: Date of Service: [name], [name] 11/09/2022 9:30 A M Medical Record Number: [id number] Patient Account Number: [id number] Date of Birth/Sex: Treating RN: 1940/08/03 (82 y.o. Freddy Finner Primary Care Evelyn Aguinaldo: Darnelle Spangle Other Clinician: Referring Dorance Spink: Treating Talin Feister/Extender: Paulina Fusi Weeks in Treatment: 0 Foot Assessment Items Site Locations + = Sensation present, - = Sensation absent, C = Callus, U = Ulcer R = Redness, W = Warmth, M = Maceration, PU = Pre-ulcerative lesion F = Fissure, S = Swelling, D = Dryness Assessment Right: Left: Other Deformity: No No Prior Foot Ulcer: No  No Prior Amputation: No No Charcot Joint: No No Ambulatory Status: Ambulatory Without Help Gait: Steady Electronic Signature(s) Signed: 11/13/2022 7:57:59 AM By: Yevonne Pax RN Entered By: Yevonne Pax on 11/09/2022 09:42:29 -------------------------------------------------------------------------------- Nutrition Risk Screening Details Patient Name: Date of Service: [name], [name] 11/09/2022 9:30 A M Medical Record Number: [id number] Patient Account Number: [id number] Date of Birth/Sex: Treating RN: 1941-06-18 (82 y.o. Freddy Finner Primary Care Parisa Pinela: Darnelle Spangle Other Clinician: Referring Valene Villa: Treating Margeaux Swantek/Extender: Paulina Fusi Weeks in Treatment: 0 Height (in): 64 Weight (lbs): 190 Body Mass Index (BMI): 32.6 Ossun, Milee ([id number]) [phone] [id number].pdf Page 4 of 4 Nutrition Risk Screening Items Score Screening NUTRITION RISK SCREEN: I have an illness or condition that made me change the kind and/or amount of food I eat 2 Yes I eat fewer than two meals per day 0 No I eat few fruits and vegetables, or milk products 0 No I have three or more drinks of beer, liquor or wine almost every day 0 No I have tooth or mouth problems that make it hard for me to eat 0 No I don't always have enough money to buy the food I need 0 No I eat alone most of the time 0 No I take three or more different prescribed or over-the-counter drugs a day 1 Yes Without wanting to, I have lost or gained 10 pounds in the last six months 0 No I am not always physically able to shop, cook and/or feed myself 0 No Nutrition Protocols Good Risk Protocol Moderate Risk Protocol 0 Provide education on nutrition High Risk Proctocol Risk Level: Moderate Risk Score: 3 Electronic Signature(s) Signed: 11/13/2022 7:57:59 AM By: Yevonne Pax RN Entered By: Yevonne Pax on 11/09/2022 09:30:12

## 2022-11-16 ENCOUNTER — Encounter: Payer: Medicare PPO | Admitting: Physician Assistant

## 2022-11-16 DIAGNOSIS — E11622 Type 2 diabetes mellitus with other skin ulcer: Secondary | ICD-10-CM | POA: Diagnosis not present

## 2022-11-16 NOTE — Progress Notes (Signed)
YASHEIKA, CANCHOLA ([id number]) [id number] Page 1 of 1 Visit Report for 11/16/2022 Chief Complaint Document Details Patient Name: Date of Service: VERLIA, MEIKLE 11/16/2022 11:00 A M Medical Record Number: [id number] Patient Account Number: [id number] Date of Birth/Sex: Treating RN: [birth year] (82 y.o. Freddy Finner Primary Care Provider: Darnelle Spangle Other Clinician: Referring Provider: Treating Provider/Extender: Paulina Fusi Weeks in Treatment: 1 Information Obtained from: Patient Chief Complaint Right LE Ulcer Electronic Signature(s) Signed: 11/16/2022 11:11:45 AM By: Allen Derry PA-C Entered By: Allen Derry on 11/16/2022 11:11:45 -------------------------------------------------------------------------------- Problem List Details Patient Name: Date of Service: RHODIA, DEPASS 11/16/2022 11:00 A M Medical Record Number: [id number] Patient Account Number: [id number] Date of Birth/Sex: Treating RN: February 18, 1941 (82 y.o. 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

## 2022-11-19 ENCOUNTER — Ambulatory Visit: Payer: Medicare PPO

## 2022-11-24 ENCOUNTER — Encounter: Payer: Medicare PPO | Admitting: Physician Assistant

## 2022-11-24 DIAGNOSIS — E11622 Type 2 diabetes mellitus with other skin ulcer: Secondary | ICD-10-CM | POA: Diagnosis not present

## 2022-11-27 ENCOUNTER — Ambulatory Visit: Payer: Medicare PPO

## 2022-11-30 ENCOUNTER — Encounter: Payer: Medicare PPO | Attending: Physician Assistant | Admitting: Physician Assistant

## 2022-11-30 DIAGNOSIS — I87331 Chronic venous hypertension (idiopathic) with ulcer and inflammation of right lower extremity: Secondary | ICD-10-CM | POA: Diagnosis not present

## 2022-11-30 DIAGNOSIS — E11622 Type 2 diabetes mellitus with other skin ulcer: Secondary | ICD-10-CM | POA: Diagnosis present

## 2022-11-30 DIAGNOSIS — L97812 Non-pressure chronic ulcer of other part of right lower leg with fat layer exposed: Secondary | ICD-10-CM | POA: Diagnosis not present

## 2022-11-30 DIAGNOSIS — I10 Essential (primary) hypertension: Secondary | ICD-10-CM | POA: Insufficient documentation

## 2022-11-30 NOTE — Progress Notes (Signed)
SHAKIRIA, PERL ([id number]) [id number] Page 1 of 3 Visit Report for 11/30/2022 Chief Complaint Document Details Patient Name: Date of Service: [name], [name] 11/30/2022 11:00 A M Medical Record Number: [id number] Patient Account Number: [id number] Date of Birth/Sex: Treating RN: 18-Dec-1940 (82 y.o. Freddy Finner Primary Care Provider: Darnelle Spangle Other Clinician: Referring Provider: Treating Provider/Extender: Paulina Fusi Weeks in Treatment: 3 Information Obtained from: Patient Chief Complaint Right LE Ulcer Electronic Signature(s) Signed: 11/30/2022 11:13:54 AM By: Allen Derry PA-C Entered By: Allen Derry on 11/30/2022 11:13:54 -------------------------------------------------------------------------------- Physician Orders Details Patient Name: Date of Service: [name] 11/30/2022 11:00 A M Medical Record Number: [id number] Patient Account Number: [id number] Date of Birth/Sex: Treating RN: [birth year] (82 y.o. 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 Bathing/ Shower/ Hygiene May shower with wound dressing protected with water repellent cover or cast protector. No tub bath. Anesthetic (Use 'Patient Medications' Section for Anesthetic Order Entry) Lidocaine applied to wound bed Edema Control - Lymphedema / Segmental Compressive Device / Other Elevate, Exercise Daily and A void Standing  for Long Periods of Time. Elevate legs to the level of the heart and pump ankles as often as possible Elevate leg(s) parallel to the floor when sitting. Wound Treatment Wound #1 - Lower Leg Wound Laterality: Right, Medial Cleanser: Byram Ancillary Kit - 15 Day Supply (Generic) 3 x Per Week/30 Days Discharge Instructions: Use supplies as instructed; Kit contains: (15) Saline Bullets; (15) 3x3 Gauze; 15 pr Gloves Cleanser: Soap and Water 3 x Per Week/30 Days Discharge Instructions: Gently cleanse wound with antibacterial soap, rinse and pat dry prior to dressing wounds Cleanser: Wound Cleanser (Generic) 3 x Per Week/30 Days Discharge Instructions: Wash your hands with soap and water. Remove old dressing, discard into plastic bag and place into trash. Cleanse the wound with Wound Cleanser prior to applying a clean dressing using gauze sponges, not tissues or cotton balls. Do not scrub or use excessive force. Pat dry using gauze sponges, not tissue or cotton balls. [name] ([id number]) [id number] Page 2 of 3 Prim Dressing: Prisma 4.34 (in) 3 x Per Week/30 Days ary Discharge Instructions: Moisten w/normal saline or sterile water; Cover wound as directed. Do not remove from wound bed. Secondary Dressing: (BORDER) Zetuvit Plus SILICONE BORDER Dressing 5x5 (in/in) (Generic) 3 x Per Week/30 Days Discharge Instructions: Please do not put silicone bordered dressings under wraps. Use non-bordered dressing only. Secured With: Tubigrip Size D, 3x10 (in/yd) 3 x Per Week/30 Days Discharge Instructions: double layer Electronic Signature(s) Unsigned Entered By: Yevonne Pax on 11/30/2022 11:45:26 -------------------------------------------------------------------------------- Problem List Details Patient Name: Date of Service: [name] 11/30/2022 11:00 A M Medical Record Number: [id number] Patient Account Number: [id number] Date of Birth/Sex: Treating  RN: 10/26/1940 (82 y.o. Freddy Finner Primary Care Provider: Darnelle Spangle Other Clinician: Referring Provider: Treating Provider/Extender: Paulina Fusi Weeks in Treatment: 3 Active Problems ICD-10 Encounter Code Description Active Date MDM Diagnosis E11.622 Type 2 diabetes mellitus with other skin ulcer 11/09/2022 No Yes I87.331 Chronic venous hypertension (idiopathic) with ulcer and inflammation of right 11/09/2022  No Yes lower extremity L97.812 Non-pressure chronic ulcer of other part of right lower leg with fat layer 11/09/2022 No Yes exposed I10 Essential (primary) hypertension 11/09/2022 No Yes Inactive Problems Resolved Problems Electronic Signature(s) Signed: 11/30/2022 11:13:51 AM By: Allen Derry PA-C Entered By: Allen Derry on 11/30/2022 11:13:51 -------------------------------------------------------------------------------- SuperBill Details Patient Name: Date of Service: [name] 11/30/2022 Medical Record Number: [id number] Patient Account Number: [id number] Date of Birth/Sex: Treating RN: Nov 22, 1940 (82 y.o. 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

## 2022-12-07 ENCOUNTER — Encounter: Payer: Medicare PPO | Admitting: Physician Assistant

## 2022-12-07 DIAGNOSIS — E11622 Type 2 diabetes mellitus with other skin ulcer: Secondary | ICD-10-CM | POA: Diagnosis not present

## 2022-12-07 NOTE — Progress Notes (Addendum)
[name] ([id number]) [id number] Page 1 of 6 Visit Report for 12/07/2022 Chief Complaint Document Details Patient Name: Date of Service: [name], [name] 12/07/2022 11:00 A M Medical Record Number: [id number] Patient Account Number: [id number] Date of Birth/Sex: Treating RN: 17-Feb-1941 (82 y.o. Freddy Finner Primary Care Provider: Darnelle Spangle Other Clinician: Referring Provider: Treating Provider/Extender: Paulina Fusi Weeks in Treatment: 4 Information Obtained from: Patient Chief Complaint Right LE Ulcer Electronic Signature(s) Signed: 12/07/2022 11:09:50 AM By: Allen Derry PA-C Entered By: Allen Derry on 12/07/2022 11:09:50 -------------------------------------------------------------------------------- HPI Details Patient Name: Date of Service: [name], [name] 12/07/2022 11:00 A M Medical Record Number: [id number] Patient Account Number: [id number] Date of Birth/Sex: Treating RN: Nov 05, 1940 (82 y.o. Freddy Finner Primary Care Provider: Darnelle Spangle Other Clinician: Referring Provider: Treating Provider/Extender: Paulina Fusi Weeks in Treatment: 4 History of Present Illness HPI Description: 11-09-2022 upon evaluation today patient appears to be doing somewhat poorly in regard to her wound on her right medial lower extremity. This occurred as a result of the trauma initially which I think turned into more of an abscess. This does have some tracking underneath she has 3 openings all which are part of the same wound. I am unsure if this is going end up opening up into the entire region or not but time will tell as far as that is concerned. With that being said right now she has been on doxycycline earlier in April she is out of the mupirocin at this point. With that being said I think she may need to be back on the doxycycline in order to ensure especially after I cleaned this up today that we do not worsen  anything from an infection standpoint. Patient does have a history of chronic venous hypertension, hypertension, and diabetes mellitus type 2. Her most recent hemoglobin A1c was 5.9 on October 20, 2022 11-16-2022 upon evaluation today patient appears to be doing better in regard to her wound which is actually slowly started to look improved as far as the overall appearance of the wound bed is concerned. Fortunately I do not see any signs of active infection locally or systemically which is great news and overall I am extremely pleased with where things stand currently. 11-24-2022 upon evaluation today patient appears to be doing well currently in regard to her leg ulcer. She has been making some progress here. Some of the skin is thinned out between the openings and I think we probably need to open this up some of these more confluent area versus the spider openings in multiple areas. [name] ([id number]) [id number] Page 2 of 6 11-30-2022 upon evaluation today patient appears to be doing well currently in regard to her wound she has been tolerating the dressing changes without complication. With that being said I do think that we are making pretty good progress here which is great news. I do not see any evidence of active infection locally nor systemically which is excellent as well. 12-07-2022 upon evaluation today patient actually appears to be making some great improvements here in regard to her wound. In fact this is showing signs of good granulation and there is really no need for sharp debridement today which is great news as well. Electronic Signature(s) Signed: 12/07/2022 5:51:39 PM By: Allen Derry PA-C Entered By: Allen Derry on 12/07/2022 17:51:39 -------------------------------------------------------------------------------- Physical Exam Details Patient Name: Date of Service: [name], [name] 12/07/2022 11:00 A M Medical Record Number:  [id number] Patient Account Number: [id number] Date of Birth/Sex:  Treating RN: 01-15-1941 (82 y.o. Freddy Finner Primary Care Provider: Darnelle Spangle Other Clinician: Referring Provider: Treating Provider/Extender: Paulina Fusi Weeks in Treatment: 4 Constitutional Well-nourished and well-hydrated in no acute distress. Respiratory normal breathing without difficulty. Psychiatric this patient is able to make decisions and demonstrates good insight into disease process. Alert and Oriented x 3. pleasant and cooperative. Notes Upon inspection patient's wound bed actually showed signs of good granulation epithelization at this point. Fortunately I do not see any signs of active infection locally nor systemically which is great news and in general I do believe that we are making really good progress here towards closure. Electronic Signature(s) Signed: 12/07/2022 5:51:54 PM By: Allen Derry PA-C Entered By: Allen Derry on 12/07/2022 17:51:54 -------------------------------------------------------------------------------- Physician Orders Details Patient Name: Date of Service: [name] 12/07/2022 11:00 A M Medical Record Number: [id number] Patient Account Number: [id number] Date of Birth/Sex: Treating RN: 08/07/1940 (82 y.o. Freddy Finner Primary Care Provider: Darnelle Spangle Other Clinician: Referring Provider: Treating Provider/Extender: Paulina Fusi Weeks in Treatment: 4 Verbal / Phone Orders: No [name], [name] ([id number]) [id number] Page 3 of 6 Diagnosis Coding ICD-10 Coding Code Description E11.622 Type 2 diabetes mellitus with other skin ulcer I87.331 Chronic venous hypertension (idiopathic) with ulcer and inflammation of right lower extremity L97.812 Non-pressure chronic ulcer of other part of right lower leg with fat layer exposed I10 Essential (primary) hypertension Follow-up Appointments Return Appointment  in 1 week. Bathing/ Shower/ Hygiene May shower with wound dressing protected with water repellent cover or cast protector. No tub bath. Anesthetic (Use 'Patient Medications' Section for Anesthetic Order Entry) Lidocaine applied to wound bed Edema Control - Lymphedema / Segmental Compressive Device / Other Elevate, Exercise Daily and A void Standing for Long Periods of Time. Elevate legs to the level of the heart and pump ankles as often as possible Elevate leg(s) parallel to the floor when sitting. Wound Treatment Wound #1 - Lower Leg Wound Laterality: Right, Medial Cleanser: Byram Ancillary Kit - 15 Day Supply (Generic) 3 x Per Week/30 Days Discharge Instructions: Use supplies as instructed; Kit contains: (15) Saline Bullets; (15) 3x3 Gauze; 15 pr Gloves Cleanser: Soap and Water 3 x Per Week/30 Days Discharge Instructions: Gently cleanse wound with antibacterial soap, rinse and pat dry prior to dressing wounds Cleanser: Wound Cleanser (Generic) 3 x Per Week/30 Days Discharge Instructions: Wash your hands with soap and water. Remove old dressing, discard into plastic bag and place into trash. Cleanse the wound with Wound Cleanser prior to applying a clean dressing using gauze sponges, not tissues or cotton balls. Do not scrub or use excessive force. Pat dry using gauze sponges, not tissue or cotton balls. Prim Dressing: Prisma 4.34 (in) 3 x Per Week/30 Days ary Discharge Instructions: Moisten w/normal saline or sterile water; Cover wound as directed. Do not remove from wound bed. Secondary Dressing: (BORDER) Zetuvit Plus SILICONE BORDER Dressing 5x5 (in/in) (Generic) 3 x Per Week/30 Days Discharge Instructions: Please do not put silicone bordered dressings under wraps. Use non-bordered dressing only. Secured With: Tubigrip Size D, 3x10 (in/yd) 3 x Per Week/30 Days Discharge Instructions: double layer Electronic Signature(s) Signed: 12/08/2022 6:26:43 PM By: Allen Derry PA-C Signed:  12/10/2022 4:13:25 PM By: Yevonne Pax RN Entered By: Yevonne Pax on 12/07/2022 11:45:46 -------------------------------------------------------------------------------- Problem List Details Patient Name: Date of Service: [name], [name] 12/07/2022 11:00 A M Medical Record Number: [id number] Patient Account Number: [id number] Date of Birth/Sex: Treating RN: Aug 28, 1940 (82 y.o. F) Epps, Carrie Primary  Care Provider: Darnelle Spangle Other Clinician: Referring Provider: Treating Provider/Extender: Paulina Fusi Weeks in Treatment: 4 [place], [name] ([id number]) [id number] Page 4 of 6 Active Problems ICD-10 Encounter Code Description Active Date MDM Diagnosis E11.622 Type 2 diabetes mellitus with other skin ulcer 11/09/2022 No Yes I87.331 Chronic venous hypertension (idiopathic) with ulcer and inflammation of right 11/09/2022 No Yes lower extremity L97.812 Non-pressure chronic ulcer of other part of right lower leg with fat layer 11/09/2022 No Yes exposed I10 Essential (primary) hypertension 11/09/2022 No Yes Inactive Problems Resolved Problems Electronic Signature(s) Signed: 12/07/2022 11:09:44 AM By: Allen Derry PA-C Entered By: Allen Derry on 12/07/2022 11:09:44 -------------------------------------------------------------------------------- Progress Note Details Patient Name: Date of Service: [name], [name] 12/07/2022 11:00 A M Medical Record Number: [id number] Patient Account Number: [id number] Date of Birth/Sex: Treating RN: 03-04-1941 (82 y.o. Freddy Finner Primary Care Provider: Darnelle Spangle Other Clinician: Referring Provider: Treating Provider/Extender: Paulina Fusi Weeks in Treatment: 4 Subjective Chief Complaint Information obtained from Patient Right LE Ulcer History of Present Illness (HPI) 11-09-2022 upon evaluation today patient appears to be doing somewhat poorly in regard to her wound on her right  medial lower extremity. This occurred as a result of the trauma initially which I think turned into more of an abscess. This does have some tracking underneath she has 3 openings all which are part of the same wound. I am unsure if this is going end up opening up into the entire region or not but time will tell as far as that is concerned. With that being said right now she has been on doxycycline earlier in April she is out of the mupirocin at this point. With that being said I think she may need to be back on the doxycycline in order to ensure especially after I cleaned this up today that we do not worsen anything from an infection standpoint. Patient does have a history of chronic venous hypertension, hypertension, and diabetes mellitus type 2. Her most recent hemoglobin A1c was 5.9 on October 20, 2022 11-16-2022 upon evaluation today patient appears to be doing better in regard to her wound which is actually slowly started to look improved as far as the overall appearance of the wound bed is concerned. Fortunately I do not see any signs of active infection locally or systemically which is great news and overall I am extremely pleased with where things stand currently. 11-24-2022 upon evaluation today patient appears to be doing well currently in regard to her leg ulcer. She has been making some progress here. Some of the skin is thinned out between the openings and I think we probably need to open this up some of these more confluent area versus the spider openings in [name], [name] ([id number]) [id number] Page 5 of 6 multiple areas. 11-30-2022 upon evaluation today patient appears to be doing well currently in regard to her wound she has been tolerating the dressing changes without complication. With that being said I do think that we are making pretty good progress here which is great news. I do not see any evidence of active infection locally nor systemically which  is excellent as well. 12-07-2022 upon evaluation today patient actually appears to be making some great improvements here in regard to her wound. In fact this is showing signs of good granulation and there is really no need for sharp debridement today which is great news as well. Objective Constitutional Well-nourished and well-hydrated in no acute distress. Vitals Time Taken: 10:58 AM, Height: 64  in, Weight: 190 lbs, BMI: 32.6, Temperature: 97.8 F, Pulse: 68 bpm, Respiratory Rate: 18 breaths/min, Blood Pressure: 137/74 mmHg. Respiratory normal breathing without difficulty. Psychiatric this patient is able to make decisions and demonstrates good insight into disease process. Alert and Oriented x 3. pleasant and cooperative. General Notes: Upon inspection patient's wound bed actually showed signs of good granulation epithelization at this point. Fortunately I do not see any signs of active infection locally nor systemically which is great news and in general I do believe that we are making really good progress here towards closure. Integumentary (Hair, Skin) Wound #1 status is Open. Original cause of wound was Trauma. The date acquired was: 10/12/2022. The wound has been in treatment 4 weeks. The wound is located on the Right,Medial Lower Leg. The wound measures 4.6cm length x 2cm width x 0.4cm depth; 7.226cm^2 area and 2.89cm^3 volume. There is Fat Layer (Subcutaneous Tissue) exposed. There is a medium amount of serosanguineous drainage noted. There is medium (34-66%) granulation within the wound bed. There is a small (1-33%) amount of necrotic tissue within the wound bed including Adherent Slough. Assessment Active Problems ICD-10 Type 2 diabetes mellitus with other skin ulcer Chronic venous hypertension (idiopathic) with ulcer and inflammation of right lower extremity Non-pressure chronic ulcer of other part of right lower leg with fat layer exposed Essential (primary)  hypertension Plan Follow-up Appointments: Return Appointment in 1 week. Bathing/ Shower/ Hygiene: May shower with wound dressing protected with water repellent cover or cast protector. No tub bath. Anesthetic (Use 'Patient Medications' Section for Anesthetic Order Entry): Lidocaine applied to wound bed Edema Control - Lymphedema / Segmental Compressive Device / Other: Elevate, Exercise Daily and Avoid Standing for Long Periods of Time. Elevate legs to the level of the heart and pump ankles as often as possible Elevate leg(s) parallel to the floor when sitting. WOUND #1: - Lower Leg Wound Laterality: Right, Medial Cleanser: Byram Ancillary Kit - 15 Day Supply (Generic) 3 x Per Week/30 Days Discharge Instructions: Use supplies as instructed; Kit contains: (15) Saline Bullets; (15) 3x3 Gauze; 15 pr Gloves Cleanser: Soap and Water 3 x Per Week/30 Days Discharge Instructions: Gently cleanse wound with antibacterial soap, rinse and pat dry prior to dressing wounds Cleanser: Wound Cleanser (Generic) 3 x Per Week/30 Days Discharge Instructions: Wash your hands with soap and water. Remove old dressing, discard into plastic bag and place into trash. Cleanse the wound with Wound Cleanser prior to applying a clean dressing using gauze sponges, not tissues or cotton balls. Do not scrub or use excessive force. Pat dry using gauze sponges, not tissue or cotton balls. Prim Dressing: Prisma 4.34 (in) 3 x Per Week/30 Days ary Discharge Instructions: Moisten w/normal saline or sterile water; Cover wound as directed. Do not remove from wound bed. Secondary Dressing: (BORDER) Zetuvit Plus SILICONE BORDER Dressing 5x5 (in/in) (Generic) 3 x Per Week/30 Days Discharge Instructions: Please do not put silicone bordered dressings under wraps. Use non-bordered dressing only. Secured With: Tubigrip Size D, 3x10 (in/yd) 3 x Per Week/30 Days Discharge Instructions: double [name], [name] ([id number])  [id number] Page 6 of 6 1. I think that we are to continue with the collagen based dressing I really like to wait this has done up to this point I would recommend a such that we continue for the time being. 2. I am going to recommend as well that the patient should continue to monitor for any evidence of infection or worsening. Overall I think that we are really  doing excellent from the standpoint of getting this to seal down underneath and I think that we can be able to save the bridge in between the wound which is great news. We will see patient back for reevaluation in 1 week here in the clinic. If anything worsens or changes patient will contact our office for additional recommendations. Electronic Signature(s) Signed: 12/07/2022 5:52:49 PM By: Allen Derry PA-C Entered By: Allen Derry on 12/07/2022 17:52:49 -------------------------------------------------------------------------------- SuperBill Details Patient Name: Date of Service: [name], [name] 12/07/2022 Medical Record Number: [id number] Patient Account Number: [id number] Date of Birth/Sex: Treating RN: [birth year] (82 y.o. Freddy Finner Primary Care Provider: Darnelle Spangle Other Clinician: Referring Provider: Treating Provider/Extender: Paulina Fusi Weeks in Treatment: 4 Diagnosis Coding ICD-10 Codes Code Description E11.622 Type 2 diabetes mellitus with other skin ulcer I87.331 Chronic venous hypertension (idiopathic) with ulcer and inflammation of right lower extremity L97.812 Non-pressure chronic ulcer of other part of right lower leg with fat layer exposed I10 Essential (primary) hypertension Facility Procedures : CPT4 Code: [id number] Description: [phone] - WOUND CARE VISIT-LEV 2 EST PT Modifier: Quantity: 1 Physician Procedures : CPT4 Code Description Modifier [id number] - WC PHYS LEVEL 3 - EST PT ICD-10 Diagnosis Description E11.622 Type 2 diabetes mellitus with other  skin ulcer I87.331 Chronic venous hypertension (idiopathic) with ulcer and inflammation of right lower  extremity L97.812 Non-pressure chronic ulcer of other part of right lower leg with fat layer exposed I10 Essential (primary) hypertension Quantity: 1 Electronic Signature(s) Signed: 12/07/2022 5:53:05 PM By: Allen Derry PA-C Entered By: Allen Derry on 12/07/2022 17:53:04

## 2022-12-12 NOTE — Progress Notes (Signed)
Kristie, [name] ([id number]) [id number] Page 1 of 9 Visit Report for 12/07/2022 Arrival Information Details Patient Name: Date of Service: [name], [name] 12/07/2022 11:00 A M Medical Record Number: [id number] Patient Account Number: [id number] Date of Birth/Sex: Treating RN: Aug 16, 1940 (82 y.o. Freddy Finner Primary Care Laurisa Sahakian: Darnelle Spangle Other Clinician: Referring Lorieann Argueta: Treating Teandre Hamre/Extender: Paulina Fusi Weeks in Treatment: 4 Visit Information History Since Last Visit Added or deleted any medications: No Patient Arrived: Ambulatory Any new allergies or adverse reactions: No Arrival Time: 10:57 Had a fall or experienced change in No Accompanied By: husband activities of daily living that may affect Transfer Assistance: None risk of falls: Patient Identification Verified: Yes Signs or symptoms of abuse/neglect since last visito No Secondary Verification Process Completed: Yes Hospitalized since last visit: No Patient Requires Transmission-Based Precautions: No Implantable device outside of the clinic excluding No Patient Has Alerts: No cellular tissue based products placed in the center since last visit: Has Dressing in Place as Prescribed: Yes Has Compression in Place as Prescribed: Yes Pain Present Now: No Electronic Signature(s) Signed: 12/10/2022 4:13:25 PM By: Yevonne Pax RN Entered By: Yevonne Pax on 12/07/2022 10:57:53 -------------------------------------------------------------------------------- Clinic Level of Care Assessment Details Patient Name: Date of Service: Kristie, [name] 12/07/2022 11:00 A M Medical Record Number: [id number] Patient Account Number: [id number] Date of Birth/Sex: Treating RN: 1941-03-25 (82 y.o. Freddy Finner Primary Care Damascus Feldpausch: Darnelle Spangle Other Clinician: Referring Cher Franzoni: Treating Margarethe Virgen/Extender: Paulina Fusi Weeks in Treatment: 4 Clinic  Level of Care Assessment Items TOOL 4 Quantity Score X- 1 0 Use when only an EandM is performed on FOLLOW-UP visit ASSESSMENTS - Nursing Assessment / Reassessment X- 1 10 Reassessment of Co-morbidities (includes updates in patient status) X- 1 5 Reassessment of Adherence to Treatment Plan CORINTHIA, SEABERG ([id number]) [id number] Page 2 of 9 ASSESSMENTS - Wound and Skin A ssessment / Reassessment X - Simple Wound Assessment / Reassessment - one wound 1 5 []  - 0 Complex Wound Assessment / Reassessment - multiple wounds []  - 0 Dermatologic / Skin Assessment (not related to wound area) ASSESSMENTS - Focused Assessment []  - 0 Circumferential Edema Measurements - multi extremities []  - 0 Nutritional Assessment / Counseling / Intervention []  - 0 Lower Extremity Assessment (monofilament, tuning fork, pulses) []  - 0 Peripheral Arterial Disease Assessment (using hand held doppler) ASSESSMENTS - Ostomy and/or Continence Assessment and Care []  - 0 Incontinence Assessment and Management []  - 0 Ostomy Care Assessment and Management (repouching, etc.) PROCESS - Coordination of Care X - Simple Patient / Family Education for ongoing care 1 15 []  - 0 Complex (extensive) Patient / Family Education for ongoing care []  - 0 Staff obtains Chiropractor, Records, T Results / Process Orders est []  - 0 Staff telephones HHA, Nursing Homes / Clarify orders / etc []  - 0 Routine Transfer to another Facility (non-emergent condition) []  - 0 Routine Hospital Admission (non-emergent condition) []  - 0 New Admissions / Manufacturing engineer / Ordering NPWT Apligraf, etc. , []  - 0 Emergency Hospital Admission (emergent condition) X- 1 10 Simple Discharge Coordination []  - 0 Complex (extensive) Discharge Coordination PROCESS - Special Needs []  - 0 Pediatric / Minor Patient Management []  - 0 Isolation Patient Management []  - 0 Hearing / Language / Visual special  needs []  - 0 Assessment of Community assistance (transportation, D/C planning, etc.) []  - 0 Additional assistance / Altered mentation []  - 0 Support Surface(s) Assessment (bed, cushion, seat, etc.) INTERVENTIONS - Wound Cleansing / Measurement X -  Simple Wound Cleansing - one wound 1 5 []  - 0 Complex Wound Cleansing - multiple wounds X- 1 5 Wound Imaging (photographs - any number of wounds) []  - 0 Wound Tracing (instead of photographs) X- 1 5 Simple Wound Measurement - one wound []  - 0 Complex Wound Measurement - multiple wounds INTERVENTIONS - Wound Dressings X - Small Wound Dressing one or multiple wounds 1 10 []  - 0 Medium Wound Dressing one or multiple wounds []  - 0 Large Wound Dressing one or multiple wounds []  - 0 Application of Medications - topical []  - 0 Application of Medications - injection INTERVENTIONS - Miscellaneous []  - 0 External ear exam JIYANA, ROCCHI ([id number]) [id number] Page 3 of 9 []  - 0 Specimen Collection (cultures, biopsies, blood, body fluids, etc.) []  - 0 Specimen(s) / Culture(s) sent or taken to Lab for analysis []  - 0 Patient Transfer (multiple staff / Michiel Sites Lift / Similar devices) []  - 0 Simple Staple / Suture removal (25 or less) []  - 0 Complex Staple / Suture removal (26 or more) []  - 0 Hypo / Hyperglycemic Management (close monitor of Blood Glucose) []  - 0 Ankle / Brachial Index (ABI) - do not check if billed separately X- 1 5 Vital Signs Has the patient been seen at the hospital within the last three years: Yes Total Score: 75 Level Of Care: New/Established - Level 2 Electronic Signature(s) Signed: 12/10/2022 4:13:25 PM By: Yevonne Pax RN Entered By: Yevonne Pax on 12/07/2022 11:57:46 -------------------------------------------------------------------------------- Encounter Discharge Information Details Patient Name: Date of Service: [name] 12/07/2022 11:00 A M Medical Record  Number: [id number] Patient Account Number: [id number] Date of Birth/Sex: Treating RN: 1941/06/24 (82 y.o. Freddy Finner Primary Care Curtistine Pettitt: Darnelle Spangle Other Clinician: Referring Dov Dill: Treating Rhylee Pucillo/Extender: Paulina Fusi Weeks in Treatment: 4 Encounter Discharge Information Items Discharge Condition: Stable Ambulatory Status: Ambulatory Discharge Destination: Home Transportation: Private Auto Accompanied By: husband Schedule Follow-up Appointment: Yes Clinical Summary of Care: Electronic Signature(s) Signed: 12/10/2022 4:13:25 PM By: Yevonne Pax RN Entered By: Yevonne Pax on 12/07/2022 11:59:06 Lower Extremity Assessment Details -------------------------------------------------------------------------------- Peggyann Juba ([id number]) [id number] Page 4 of 9 Patient Name: Date of Service: LALLIE, LATOURETTE 12/07/2022 11:00 A M Medical Record Number: [id number] Patient Account Number: [id number] Date of Birth/Sex: Treating RN: [birth year] (82 y.o. Freddy Finner Primary Care Corey Laski: Darnelle Spangle Other Clinician: Referring Dimitra Woodstock: Treating Jenavee Laguardia/Extender: Paulina Fusi Weeks in Treatment: 4 Edema Assessment Left: Right: Assessed: No No Edema: No Calf Left: Right: Point of Measurement: 36 cm From Medial Instep 36 cm Ankle Left: Right: Point of Measurement: 11 cm From Medial Instep 24 cm Vascular Assessment Left: Right: Pulses: Dorsalis Pedis Palpable: Yes Electronic Signature(s) Signed: 12/10/2022 4:13:25 PM By: Yevonne Pax RN Entered By: Yevonne Pax on 12/07/2022 11:06:25 -------------------------------------------------------------------------------- Multi Wound Chart Details Patient Name: Date of Service: Kristie Dike, Presleigh 12/07/2022 11:00 A M Medical Record Number: [id number] Patient Account Number: [id number] Date of Birth/Sex: Treating RN: 1941/06/13 (82 y.o. Freddy Finner Primary Care Lakaisha Danish: Darnelle Spangle Other Clinician: Referring Chelci Wintermute: Treating Kalandra Masters/Extender: Paulina Fusi Weeks in Treatment: 4 Vital Signs Height(in): 64 Pulse(bpm): 68 Weight(lbs): 190 Blood Pressure(mmHg): 137/74 Body Mass Index(BMI): 32.6 Temperature(F): 97.8 Respiratory Rate(breaths/min): 18 [1:Photos:] [N/A:N/A] Right, Medial Lower Leg N/A N/A Wound Location: Trauma N/A N/A Wounding Event: DARCELL, SWASEY ([id number]) [id number] Page 5 of 9 Diabetic Wound/Ulcer of the Lower N/A N/A Primary Etiology: Extremity Lymphedema, Hypertension, Type II N/A N/A Comorbid History: Diabetes 10/12/2022 N/A N/A Date  Acquired: 4 N/A N/A Weeks of Treatment: Open N/A N/A Wound Status: No N/A N/A Wound Recurrence: 4.6x2x0.4 N/A N/A Measurements L x W x D (cm) 7.226 N/A N/A A (cm) : rea 2.89 N/A N/A Volume (cm) : 2.10% N/A N/A % Reduction in A rea: 60.90% N/A N/A % Reduction in Volume: Grade 1 N/A N/A Classification: Medium N/A N/A Exudate A mount: Serosanguineous N/A N/A Exudate Type: red, brown N/A N/A Exudate Color: Medium (34-66%) N/A N/A Granulation A mount: Small (1-33%) N/A N/A Necrotic A mount: Fat Layer (Subcutaneous Tissue): Yes N/A N/A Exposed Structures: Fascia: No Tendon: No Muscle: No Joint: No Bone: No None N/A N/A Epithelialization: Treatment Notes Electronic Signature(s) Signed: 12/10/2022 4:13:25 PM By: Yevonne Pax RN Entered By: Yevonne Pax on 12/07/2022 11:06:36 -------------------------------------------------------------------------------- Multi-Disciplinary Care Plan Details Patient Name: Date of Service: Kristie Dike, Trenell 12/07/2022 11:00 A M Medical Record Number: [id number] Patient Account Number: [id number] Date of Birth/Sex: Treating RN: [birth year] (82 y.o. Freddy Finner Primary Care Myshawn Chiriboga: Darnelle Spangle Other Clinician: Referring Mckayla Mulcahey: Treating  Laticia Vannostrand/Extender: Paulina Fusi Weeks in Treatment: 4 Active Inactive Nutrition Nursing Diagnoses: Impaired glucose control: actual or potential Goals: Patient/caregiver verbalizes understanding of need to maintain therapeutic glucose control per primary care physician Date Initiated: 11/09/2022 Target Resolution Date: 12/10/2022 Goal Status: Active Interventions: Assess HgA1c results as ordered upon admission and as needed Assess patient nutrition upon admission and as needed per policy Notes: Wound/Skin Impairment SHASHA, REHL ([id number]) [id number] Page 6 of 9 Nursing Diagnoses: Knowledge deficit related to ulceration/compromised skin integrity Goals: Patient/caregiver will verbalize understanding of skin care regimen Date Initiated: 11/09/2022 Target Resolution Date: 12/10/2022 Goal Status: Active Ulcer/skin breakdown will have a volume reduction of 30% by week 4 Date Initiated: 11/09/2022 Target Resolution Date: 12/10/2022 Goal Status: Active Ulcer/skin breakdown will have a volume reduction of 50% by week 8 Date Initiated: 11/09/2022 Target Resolution Date: 01/09/2023 Goal Status: Active Ulcer/skin breakdown will have a volume reduction of 80% by week 12 Date Initiated: 11/09/2022 Target Resolution Date: 02/09/2023 Goal Status: Active Ulcer/skin breakdown will heal within 14 weeks Date Initiated: 11/09/2022 Target Resolution Date: 03/12/2023 Goal Status: Active Interventions: Assess patient/caregiver ability to obtain necessary supplies Assess patient/caregiver ability to perform ulcer/skin care regimen upon admission and as needed Assess ulceration(s) every visit Notes: Electronic Signature(s) Signed: 12/10/2022 4:13:25 PM By: Yevonne Pax RN Entered By: Yevonne Pax on 12/07/2022 11:06:58 -------------------------------------------------------------------------------- Pain Assessment Details Patient Name: Date of Service: PAVITHRA, DEBSKI 12/07/2022 11:00 A M Medical Record Number: [id number] Patient Account Number: [id number] Date of Birth/Sex: Treating RN: 11-Feb-1941 (82 y.o. Freddy Finner Primary Care Jerel Sardina: Darnelle Spangle Other Clinician: Referring Nanie Dunkleberger: Treating Davyon Fisch/Extender: Paulina Fusi Weeks in Treatment: 4 Active Problems Location of Pain Severity and Description of Pain Patient Has Paino No Site Locations [place], [name] ([id number]) [phone].pdf Page 7 of 9 Pain Management and Medication Current Pain Management: Electronic Signature(s) Signed: 12/10/2022 4:13:25 PM By: Yevonne Pax RN Entered By: Yevonne Pax on 12/07/2022 10:58:21 -------------------------------------------------------------------------------- Patient/Caregiver Education Details Patient Name: Date of Service: Kristie Dike, Haidee 6/10/2024andnbsp11:00 A M Medical Record Number: [id number] Patient Account Number: [id number] Date of Birth/Gender: Treating RN: 11/01/1940 (82 y.o. Freddy Finner Primary Care Physician: Darnelle Spangle Other Clinician: Referring Physician: Treating Physician/Extender: Paulina Fusi Weeks in Treatment: 4 Education Assessment Education Provided To: Patient Education Topics Provided Wound/Skin Impairment: Handouts: Caring for Your Ulcer Methods: Explain/Verbal Responses: State content correctly Electronic Signature(s) Signed: 12/10/2022 4:13:25 PM By: Yevonne Pax RN Entered  ByYevonne Pax on 12/07/2022 11:07:18 -------------------------------------------------------------------------------- Wound Assessment Details Patient Name: Date of Service: QUANEISHA, APOLLO 12/07/2022 11:00 A M Medical Record Number: [id number] Patient Account Number: [id number] Date of Birth/Sex: Treating RN: 1941-06-15 (82 y.o. Freddy Finner Primary Care Darious Rehman: Darnelle Spangle Other Clinician: Referring Trayce Caravello: Treating Olean Sangster/Extender:  Paulina Fusi Weeks in Treatment: 4 Wound Status ADONIS, BLOUGH ([id number]) [id number] Page 8 of 9 Wound Number: 1 Primary Etiology: Diabetic Wound/Ulcer of the Lower Extremity Wound Location: Right, Medial Lower Leg Wound Status: Open Wounding Event: Trauma Comorbid History: Lymphedema, Hypertension, Type II Diabetes Date Acquired: 10/12/2022 Weeks Of Treatment: 4 Clustered Wound: No Photos Wound Measurements Length: (cm) 4.6 Width: (cm) 2 Depth: (cm) 0.4 Area: (cm) 7.226 Volume: (cm) 2.89 % Reduction in Area: 2.1% % Reduction in Volume: 60.9% Epithelialization: None Wound Description Classification: Grade 1 Exudate Amount: Medium Exudate Type: Serosanguineous Exudate Color: red, brown Foul Odor After Cleansing: No Slough/Fibrino Yes Wound Bed Granulation Amount: Medium (34-66%) Exposed Structure Necrotic Amount: Small (1-33%) Fascia Exposed: No Necrotic Quality: Adherent Slough Fat Layer (Subcutaneous Tissue) Exposed: Yes Tendon Exposed: No Muscle Exposed: No Joint Exposed: No Bone Exposed: No Treatment Notes Wound #1 (Lower Leg) Wound Laterality: Right, Medial Cleanser Byram Ancillary Kit - 15 Day Supply Discharge Instruction: Use supplies as instructed; Kit contains: (15) Saline Bullets; (15) 3x3 Gauze; 15 pr Gloves Soap and Water Discharge Instruction: Gently cleanse wound with antibacterial soap, rinse and pat dry prior to dressing wounds Wound Cleanser Discharge Instruction: Wash your hands with soap and water. Remove old dressing, discard into plastic bag and place into trash. Cleanse the wound with Wound Cleanser prior to applying a clean dressing using gauze sponges, not tissues or cotton balls. Do not scrub or use excessive force. Pat dry using gauze sponges, not tissue or cotton balls. Peri-Wound Care Topical Primary Dressing Prisma 4.34 (in) Discharge Instruction: Moisten w/normal saline or sterile water;  Cover wound as directed. Do not remove from wound bed. Secondary Dressing (BORDER) Zetuvit Plus SILICONE BORDER Dressing 5x5 (in/in) Discharge Instruction: Please do not put silicone bordered dressings under wraps. Use non-bordered dressing only. Secured With Tubigrip Size D, 3x10 (in/yd) Discharge Instruction: double layer Compression Magaby Beegle, Antoria ([id number]) [id number] Page 9 of 9 Compression Stockings Add-Ons Electronic Signature(s) Signed: 12/10/2022 4:13:25 PM By: Yevonne Pax RN Entered By: Yevonne Pax on 12/07/2022 11:05:10 -------------------------------------------------------------------------------- Vitals Details Patient Name: Date of Service: Kristie Dike, Anushri 12/07/2022 11:00 A M Medical Record Number: [id number] Patient Account Number: [id number] Date of Birth/Sex: Treating RN: 1941-04-25 (82 y.o. Freddy Finner Primary Care Tommy Goostree: Darnelle Spangle Other Clinician: Referring Sterling Ucci: Treating Javarus Dorner/Extender: Paulina Fusi Weeks in Treatment: 4 Vital Signs Time Taken: 10:58 Temperature (F): 97.8 Height (in): 64 Pulse (bpm): 68 Weight (lbs): 190 Respiratory Rate (breaths/min): 18 Body Mass Index (BMI): 32.6 Blood Pressure (mmHg): 137/74 Reference Range: 80 - 120 mg / dl Electronic Signature(s) Signed: 12/10/2022 4:13:25 PM By: Yevonne Pax RN Entered By: Yevonne Pax on 12/07/2022 10:58:16

## 2022-12-14 ENCOUNTER — Encounter: Payer: Medicare PPO | Admitting: Physician Assistant

## 2022-12-14 DIAGNOSIS — E11622 Type 2 diabetes mellitus with other skin ulcer: Secondary | ICD-10-CM | POA: Diagnosis not present

## 2022-12-14 NOTE — Progress Notes (Signed)
MATTINGLY, NEIMEYER ([id number]) [id number] Page 1 of 6 Visit Report for 12/14/2022 Chief Complaint Document Details Patient Name: Date of Service: [name], [name] 12/14/2022 11:00 A M Medical Record Number: [id number] Patient Account Number: [id number] Date of Birth/Sex: Treating RN: 09/24/1940 (82 y.o. Freddy Finner Primary Care Provider: Darnelle Spangle Other Clinician: Referring Provider: Treating Provider/Extender: Paulina Fusi Weeks in Treatment: 5 Information Obtained from: Patient Chief Complaint Right LE Ulcer Electronic Signature(s) Signed: 12/14/2022 11:02:49 AM By: Allen Derry PA-C Entered By: Allen Derry on 12/14/2022 11:02:49 -------------------------------------------------------------------------------- HPI Details Patient Name: Date of Service: [name], [name] 12/14/2022 11:00 A M Medical Record Number: [id number] Patient Account Number: [id number] Date of Birth/Sex: Treating RN: 18-Aug-1940 (82 y.o. Freddy Finner Primary Care Provider: Darnelle Spangle Other Clinician: Referring Provider: Treating Provider/Extender: Paulina Fusi Weeks in Treatment: 5 History of Present Illness HPI Description: 11-09-2022 upon evaluation today patient appears to be doing somewhat poorly in regard to her wound on her right medial lower extremity. This occurred as a result of the trauma initially which I think turned into more of an abscess. This does have some tracking underneath she has 3 openings all which are part of the same wound. I am unsure if this is going end up opening up into the entire region or not but time will tell as far as that is concerned. With that being said right now she has been on doxycycline earlier in April she is out of the mupirocin at this point. With that being said I think she may need to be back on the doxycycline in order to ensure especially after I cleaned this up today that we do not worsen  anything from an infection standpoint. Patient does have a history of chronic venous hypertension, hypertension, and diabetes mellitus type 2. Her most recent hemoglobin A1c was 5.9 on October 20, 2022 11-16-2022 upon evaluation today patient appears to be doing better in regard to her wound which is actually slowly started to look improved as far as the overall appearance of the wound bed is concerned. Fortunately I do not see any signs of active infection locally or systemically which is great news and overall I am extremely pleased with where things stand currently. 11-24-2022 upon evaluation today patient appears to be doing well currently in regard to her leg ulcer. She has been making some progress here. Some of the skin is thinned out between the openings and I think we probably need to open this up some of these more confluent area versus the spider openings in multiple areas. CLOIE, KITCHEN ([id number]) [id number] Page 2 of 6 11-30-2022 upon evaluation today patient appears to be doing well currently in regard to her wound she has been tolerating the dressing changes without complication. With that being said I do think that we are making pretty good progress here which is great news. I do not see any evidence of active infection locally nor systemically which is excellent as well. 12-07-2022 upon evaluation today patient actually appears to be making some great improvements here in regard to her wound. In fact this is showing signs of good granulation and there is really no need for sharp debridement today which is great news as well. 12-14-2022 upon evaluation today patient appears to be doing well currently in regard to her wounds she is actually showing some signs of improvement and I am very pleased in that regard. The undermining areas and tunneling is actually dramatically improved compared to what it  was. Psychologist, prison and probation services) Signed: 12/14/2022 11:31:07  AM By: Allen Derry PA-C Entered By: Allen Derry on 12/14/2022 11:31:07 -------------------------------------------------------------------------------- Physical Exam Details Patient Name: Date of Service: [name], [name] 12/14/2022 11:00 A M Medical Record Number: [id number] Patient Account Number: [id number] Date of Birth/Sex: Treating RN: September 12, 1940 (82 y.o. Freddy Finner Primary Care Provider: Darnelle Spangle Other Clinician: Referring Provider: Treating Provider/Extender: Paulina Fusi Weeks in Treatment: 5 Constitutional Well-nourished and well-hydrated in no acute distress. Respiratory normal breathing without difficulty. Psychiatric this patient is able to make decisions and demonstrates good insight into disease process. Alert and Oriented x 3. pleasant and cooperative. Notes Upon inspection patient's wound bed actually showed signs of good granulation epithelization at this point. Fortunately I do not see any evidence of active infection locally nor systemically which is great news. No fevers, chills, nausea, vomiting, or diarrhea. Electronic Signature(s) Signed: 12/14/2022 11:31:19 AM By: Allen Derry PA-C Entered By: Allen Derry on 12/14/2022 11:31:19 -------------------------------------------------------------------------------- Physician Orders Details Patient Name: Date of Service: KELLER, OEHLER 12/14/2022 11:00 A M Medical Record Number: [id number] Patient Account Number: [id number] Date of Birth/Sex: Treating RN: [birth year] (82 y.o. Freddy Finner Primary Care Provider: Darnelle Spangle Other Clinician: Referring Provider: Treating Provider/Extender: Paulina Fusi Weeks in Treatment: 5 [place], [place] ([id number]) [id number] Page 3 of 6 Verbal / Phone Orders: No Diagnosis Coding ICD-10 Coding Code Description E11.622 Type 2 diabetes mellitus with other skin ulcer I87.331 Chronic venous hypertension  (idiopathic) with ulcer and inflammation of right lower extremity L97.812 Non-pressure chronic ulcer of other part of right lower leg with fat layer exposed I10 Essential (primary) hypertension Follow-up Appointments Return Appointment in 1 week. Bathing/ Shower/ Hygiene May shower with wound dressing protected with water repellent cover or cast protector. No tub bath. Anesthetic (Use 'Patient Medications' Section for Anesthetic Order Entry) Lidocaine applied to wound bed Edema Control - Lymphedema / Segmental Compressive Device / Other Elevate, Exercise Daily and A void Standing for Long Periods of Time. Elevate legs to the level of the heart and pump ankles as often as possible Elevate leg(s) parallel to the floor when sitting. Wound Treatment Wound #1 - Lower Leg Wound Laterality: Right, Medial Cleanser: Byram Ancillary Kit - 15 Day Supply (Generic) 3 x Per Week/30 Days Discharge Instructions: Use supplies as instructed; Kit contains: (15) Saline Bullets; (15) 3x3 Gauze; 15 pr Gloves Cleanser: Soap and Water 3 x Per Week/30 Days Discharge Instructions: Gently cleanse wound with antibacterial soap, rinse and pat dry prior to dressing wounds Cleanser: Wound Cleanser (Generic) 3 x Per Week/30 Days Discharge Instructions: Wash your hands with soap and water. Remove old dressing, discard into plastic bag and place into trash. Cleanse the wound with Wound Cleanser prior to applying a clean dressing using gauze sponges, not tissues or cotton balls. Do not scrub or use excessive force. Pat dry using gauze sponges, not tissue or cotton balls. Prim Dressing: Prisma 4.34 (in) 3 x Per Week/30 Days ary Discharge Instructions: Moisten w/normal saline or sterile water; Cover wound as directed. Do not remove from wound bed. Secondary Dressing: (BORDER) Zetuvit Plus SILICONE BORDER Dressing 5x5 (in/in) (Generic) 3 x Per Week/30 Days Discharge Instructions: Please do not put silicone bordered dressings  under wraps. Use non-bordered dressing only. Secured With: Tubigrip Size D, 3x10 (in/yd) 3 x Per Week/30 Days Discharge Instructions: double layer Electronic Signature(s) Signed: 12/14/2022 3:37:18 PM By: Allen Derry PA-C Signed: 12/17/2022 12:58:51 PM By: Yevonne Pax RN Entered By: Yevonne Pax on 12/14/2022  11:23:41 -------------------------------------------------------------------------------- Problem List Details Patient Name: Date of Service: ALAIA, KAPOOR 12/14/2022 11:00 A M Medical Record Number: [id number] Patient Account Number: [id number] Date of Birth/Sex: Treating RN: 1940-08-04 (82 y.o. Freddy Finner Primary Care Provider: Darnelle Spangle Other Clinician: Referring Provider: Treating Provider/Extender: Paulina Fusi [place], [name] ([id number]) [id number] Page 4 of 6 Weeks in Treatment: 5 Active Problems ICD-10 Encounter Code Description Active Date MDM Diagnosis E11.622 Type 2 diabetes mellitus with other skin ulcer 11/09/2022 No Yes I87.331 Chronic venous hypertension (idiopathic) with ulcer and inflammation of right 11/09/2022 No Yes lower extremity L97.812 Non-pressure chronic ulcer of other part of right lower leg with fat layer 11/09/2022 No Yes exposed I10 Essential (primary) hypertension 11/09/2022 No Yes Inactive Problems Resolved Problems Electronic Signature(s) Signed: 12/14/2022 11:02:42 AM By: Allen Derry PA-C Entered By: Allen Derry on 12/14/2022 11:02:42 -------------------------------------------------------------------------------- Progress Note Details Patient Name: Date of Service: [name], [name] 12/14/2022 11:00 A M Medical Record Number: [id number] Patient Account Number: [id number] Date of Birth/Sex: Treating RN: 1940/11/01 (82 y.o. Freddy Finner Primary Care Provider: Darnelle Spangle Other Clinician: Referring Provider: Treating Provider/Extender: Paulina Fusi Weeks in  Treatment: 5 Subjective Chief Complaint Information obtained from Patient Right LE Ulcer History of Present Illness (HPI) 11-09-2022 upon evaluation today patient appears to be doing somewhat poorly in regard to her wound on her right medial lower extremity. This occurred as a result of the trauma initially which I think turned into more of an abscess. This does have some tracking underneath she has 3 openings all which are part of the same wound. I am unsure if this is going end up opening up into the entire region or not but time will tell as far as that is concerned. With that being said right now she has been on doxycycline earlier in April she is out of the mupirocin at this point. With that being said I think she may need to be back on the doxycycline in order to ensure especially after I cleaned this up today that we do not worsen anything from an infection standpoint. Patient does have a history of chronic venous hypertension, hypertension, and diabetes mellitus type 2. Her most recent hemoglobin A1c was 5.9 on October 20, 2022 11-16-2022 upon evaluation today patient appears to be doing better in regard to her wound which is actually slowly started to look improved as far as the overall appearance of the wound bed is concerned. Fortunately I do not see any signs of active infection locally or systemically which is great news and overall I am extremely pleased with where things stand currently. HYNLEE, STUDER ([id number]) [id number] Page 5 of 6 11-24-2022 upon evaluation today patient appears to be doing well currently in regard to her leg ulcer. She has been making some progress here. Some of the skin is thinned out between the openings and I think we probably need to open this up some of these more confluent area versus the spider openings in multiple areas. 11-30-2022 upon evaluation today patient appears to be doing well currently in regard to her wound she  has been tolerating the dressing changes without complication. With that being said I do think that we are making pretty good progress here which is great news. I do not see any evidence of active infection locally nor systemically which is excellent as well. 12-07-2022 upon evaluation today patient actually appears to be making some great improvements here in regard to her wound. In fact this is  showing signs of good granulation and there is really no need for sharp debridement today which is great news as well. 12-14-2022 upon evaluation today patient appears to be doing well currently in regard to her wounds she is actually showing some signs of improvement and I am very pleased in that regard. The undermining areas and tunneling is actually dramatically improved compared to what it was. Objective Constitutional Well-nourished and well-hydrated in no acute distress. Vitals Time Taken: 10:59 AM, Height: 64 in, Weight: 190 lbs, BMI: 32.6, Temperature: 98.4 F, Pulse: 51 bpm, Respiratory Rate: 18 breaths/min, Blood Pressure: 132/72 mmHg. Respiratory normal breathing without difficulty. Psychiatric this patient is able to make decisions and demonstrates good insight into disease process. Alert and Oriented x 3. pleasant and cooperative. General Notes: Upon inspection patient's wound bed actually showed signs of good granulation epithelization at this point. Fortunately I do not see any evidence of active infection locally nor systemically which is great news. No fevers, chills, nausea, vomiting, or diarrhea. Integumentary (Hair, Skin) Wound #1 status is Open. Original cause of wound was Trauma. The date acquired was: 10/12/2022. The wound has been in treatment 5 weeks. The wound is located on the Right,Medial Lower Leg. The wound measures 4.5cm length x 2cm width x 0.2cm depth; 7.069cm^2 area and 1.414cm^3 volume. There is Fat Layer (Subcutaneous Tissue) exposed. There is no tunneling or undermining  noted. There is a medium amount of serosanguineous drainage noted. There is medium (34-66%) granulation within the wound bed. There is a small (1-33%) amount of necrotic tissue within the wound bed including Adherent Slough. Assessment Active Problems ICD-10 Type 2 diabetes mellitus with other skin ulcer Chronic venous hypertension (idiopathic) with ulcer and inflammation of right lower extremity Non-pressure chronic ulcer of other part of right lower leg with fat layer exposed Essential (primary) hypertension Plan Follow-up Appointments: Return Appointment in 1 week. Bathing/ Shower/ Hygiene: May shower with wound dressing protected with water repellent cover or cast protector. No tub bath. Anesthetic (Use 'Patient Medications' Section for Anesthetic Order Entry): Lidocaine applied to wound bed Edema Control - Lymphedema / Segmental Compressive Device / Other: Elevate, Exercise Daily and Avoid Standing for Long Periods of Time. Elevate legs to the level of the heart and pump ankles as often as possible Elevate leg(s) parallel to the floor when sitting. WOUND #1: - Lower Leg Wound Laterality: Right, Medial Cleanser: Byram Ancillary Kit - 15 Day Supply (Generic) 3 x Per Week/30 Days Discharge Instructions: Use supplies as instructed; Kit contains: (15) Saline Bullets; (15) 3x3 Gauze; 15 pr Gloves Cleanser: Soap and Water 3 x Per Week/30 Days Discharge Instructions: Gently cleanse wound with antibacterial soap, rinse and pat dry prior to dressing wounds Cleanser: Wound Cleanser (Generic) 3 x Per Week/30 Days Discharge Instructions: Wash your hands with soap and water. Remove old dressing, discard into plastic bag and place into trash. Cleanse the wound with Wound Cleanser prior to applying a clean dressing using gauze sponges, not tissues or cotton balls. Do not scrub or use excessive force. Pat dry using gauze sponges, not tissue or cotton balls. [name], [name] ([id number])  [id number] Page 6 of 6 Prim Dressing: Prisma 4.34 (in) 3 x Per Week/30 Days ary Discharge Instructions: Moisten w/normal saline or sterile water; Cover wound as directed. Do not remove from wound bed. Secondary Dressing: (BORDER) Zetuvit Plus SILICONE BORDER Dressing 5x5 (in/in) (Generic) 3 x Per Week/30 Days Discharge Instructions: Please do not put silicone bordered dressings under wraps. Use non-bordered dressing only. Secured  With: Tubigrip Size D, 3x10 (in/yd) 3 x Per Week/30 Days Discharge Instructions: double layer 1. I would recommend that we have the patient continue to monitor for any evidence of infection or worsening. In general I think that we are making some really good progress towards complete closure. 2. I am also going to recommend that we have the patient continue with the Tubigrip we will also continue with the bordered foam dressing and again were using right now collagen which I feel like is doing quite well. We will see patient back for reevaluation in 1 week here in the clinic. If anything worsens or changes patient will contact our office for additional recommendations. Electronic Signature(s) Signed: 12/14/2022 11:31:52 AM By: Allen Derry PA-C Entered By: Allen Derry on 12/14/2022 11:31:52 -------------------------------------------------------------------------------- SuperBill Details Patient Name: Date of Service: [name], [name] 12/14/2022 Medical Record Number: [id number] Patient Account Number: [id number] Date of Birth/Sex: Treating RN: [birth year] (82 y.o. Freddy Finner Primary Care Provider: Darnelle Spangle Other Clinician: Referring Provider: Treating Provider/Extender: Paulina Fusi Weeks in Treatment: 5 Diagnosis Coding ICD-10 Codes Code Description E11.622 Type 2 diabetes mellitus with other skin ulcer I87.331 Chronic venous hypertension (idiopathic) with ulcer and inflammation of right lower  extremity L97.812 Non-pressure chronic ulcer of other part of right lower leg with fat layer exposed I10 Essential (primary) hypertension Facility Procedures : CPT4 Code: [id number] Description: [id number] - WOUND CARE VISIT-LEV 2 EST PT Modifier: Quantity: 1 Physician Procedures : CPT4 Code Description Modifier [id number] - WC PHYS LEVEL 3 - EST PT ICD-10 Diagnosis Description E11.622 Type 2 diabetes mellitus with other skin ulcer I87.331 Chronic venous hypertension (idiopathic) with ulcer and inflammation of right lower  extremity L97.812 Non-pressure chronic ulcer of other part of right lower leg with fat layer exposed I10 Essential (primary) hypertension Quantity: 1 Electronic Signature(s) Signed: 12/14/2022 11:32:15 AM By: Allen Derry PA-C Entered By: Allen Derry on 12/14/2022 11:32:15

## 2022-12-17 NOTE — Progress Notes (Signed)
[name], [name] ([id number]) [id number] Page 1 of 9 Visit Report for 12/14/2022 Arrival Information Details Patient Name: Date of Service: [name], [name] 12/14/2022 11:00 A M Medical Record Number: [id number] Patient Account Number: [id number] Date of Birth/Sex: Treating RN: May 29, 1941 (82 y.o. Freddy Finner Primary Care Anjuli Gemmill: Darnelle Spangle Other Clinician: Referring Cerria Randhawa: Treating Glady Ouderkirk/Extender: Paulina Fusi Weeks in Treatment: 5 Visit Information History Since Last Visit Added or deleted any medications: No Patient Arrived: Ambulatory Any new allergies or adverse reactions: No Arrival Time: 10:59 Had a fall or experienced change in No Accompanied By: husband activities of daily living that may affect Transfer Assistance: None risk of falls: Patient Identification Verified: Yes Signs or symptoms of abuse/neglect since last visito No Secondary Verification Process Completed: Yes Hospitalized since last visit: No Patient Requires Transmission-Based Precautions: No Implantable device outside of the clinic excluding No Patient Has Alerts: No cellular tissue based products placed in the center since last visit: Has Dressing in Place as Prescribed: Yes Has Compression in Place as Prescribed: Yes Pain Present Now: No Electronic Signature(s) Signed: 12/17/2022 12:58:51 PM By: Yevonne Pax RN Entered By: Yevonne Pax on 12/14/2022 10:59:51 -------------------------------------------------------------------------------- Clinic Level of Care Assessment Details Patient Name: Date of Service: [name], [name] 12/14/2022 11:00 A M Medical Record Number: [id number] Patient Account Number: [id number] Date of Birth/Sex: Treating RN: 1941-05-21 (82 y.o. Freddy Finner Primary Care Josselin Gaulin: Darnelle Spangle Other Clinician: Referring Maesyn Frisinger: Treating Yasir Kitner/Extender: Paulina Fusi Weeks in Treatment: 5 Clinic  Level of Care Assessment Items TOOL 4 Quantity Score X- 1 0 Use when only an EandM is performed on FOLLOW-UP visit ASSESSMENTS - Nursing Assessment / Reassessment X- 1 10 Reassessment of Co-morbidities (includes updates in patient status) X- 1 5 Reassessment of Adherence to Treatment Plan TIASIA, TYREE ([id number]) [id number] Page 2 of 9 ASSESSMENTS - Wound and Skin A ssessment / Reassessment X - Simple Wound Assessment / Reassessment - one wound 1 5 []  - 0 Complex Wound Assessment / Reassessment - multiple wounds []  - 0 Dermatologic / Skin Assessment (not related to wound area) ASSESSMENTS - Focused Assessment []  - 0 Circumferential Edema Measurements - multi extremities []  - 0 Nutritional Assessment / Counseling / Intervention []  - 0 Lower Extremity Assessment (monofilament, tuning fork, pulses) []  - 0 Peripheral Arterial Disease Assessment (using hand held doppler) ASSESSMENTS - Ostomy and/or Continence Assessment and Care []  - 0 Incontinence Assessment and Management []  - 0 Ostomy Care Assessment and Management (repouching, etc.) PROCESS - Coordination of Care X - Simple Patient / Family Education for ongoing care 1 15 []  - 0 Complex (extensive) Patient / Family Education for ongoing care []  - 0 Staff obtains Chiropractor, Records, T Results / Process Orders est []  - 0 Staff telephones HHA, Nursing Homes / Clarify orders / etc []  - 0 Routine Transfer to another Facility (non-emergent condition) []  - 0 Routine Hospital Admission (non-emergent condition) []  - 0 New Admissions / Manufacturing engineer / Ordering NPWT Apligraf, etc. , []  - 0 Emergency Hospital Admission (emergent condition) X- 1 10 Simple Discharge Coordination []  - 0 Complex (extensive) Discharge Coordination PROCESS - Special Needs []  - 0 Pediatric / Minor Patient Management []  - 0 Isolation Patient Management []  - 0 Hearing / Language / Visual special  needs []  - 0 Assessment of Community assistance (transportation, D/C planning, etc.) []  - 0 Additional assistance / Altered mentation []  - 0 Support Surface(s) Assessment (bed, cushion, seat, etc.) INTERVENTIONS - Wound Cleansing / Measurement X -  Simple Wound Cleansing - one wound 1 5 []  - 0 Complex Wound Cleansing - multiple wounds X- 1 5 Wound Imaging (photographs - any number of wounds) []  - 0 Wound Tracing (instead of photographs) X- 1 5 Simple Wound Measurement - one wound []  - 0 Complex Wound Measurement - multiple wounds INTERVENTIONS - Wound Dressings X - Small Wound Dressing one or multiple wounds 1 10 []  - 0 Medium Wound Dressing one or multiple wounds []  - 0 Large Wound Dressing one or multiple wounds []  - 0 Application of Medications - topical []  - 0 Application of Medications - injection INTERVENTIONS - Miscellaneous []  - 0 External ear exam Hoda, Postier Lashana ([id number]) [phone].pdf Page 3 of 9 []  - 0 Specimen Collection (cultures, biopsies, blood, body fluids, etc.) []  - 0 Specimen(s) / Culture(s) sent or taken to Lab for analysis []  - 0 Patient Transfer (multiple staff / Michiel Sites Lift / Similar devices) []  - 0 Simple Staple / Suture removal (25 or less) []  - 0 Complex Staple / Suture removal (26 or more) []  - 0 Hypo / Hyperglycemic Management (close monitor of Blood Glucose) []  - 0 Ankle / Brachial Index (ABI) - do not check if billed separately X- 1 5 Vital Signs Has the patient been seen at the hospital within the last three years: Yes Total Score: 75 Level Of Care: New/Established - Level 2 Electronic Signature(s) Signed: 12/17/2022 12:58:51 PM By: Yevonne Pax RN Entered By: Yevonne Pax on 12/14/2022 11:25:56 -------------------------------------------------------------------------------- Encounter Discharge Information Details Patient Name: Date of Service: [name], [name] 12/14/2022 11:00 A M Medical Record  Number: [id number] Patient Account Number: [id number] Date of Birth/Sex: Treating RN: April 21, 1941 (82 y.o. Freddy Finner Primary Care Kona Yusuf: Darnelle Spangle Other Clinician: Referring Shahmeer Bunn: Treating Alexxis Mackert/Extender: Paulina Fusi Weeks in Treatment: 5 Encounter Discharge Information Items Discharge Condition: Stable Ambulatory Status: Ambulatory Discharge Destination: Home Transportation: Private Auto Accompanied By: husband Schedule Follow-up Appointment: Yes Clinical Summary of Care: Electronic Signature(s) Signed: 12/14/2022 12:42:12 PM By: Yevonne Pax RN Entered By: Yevonne Pax on 12/14/2022 12:42:12 Lower Extremity Assessment Details -------------------------------------------------------------------------------- [name] ([id number]) [phone].pdf Page 4 of 9 Patient Name: Date of Service: [name], [name] 12/14/2022 11:00 A M Medical Record Number: [id number] Patient Account Number: [id number] Date of Birth/Sex: Treating RN: 1940-08-07 (82 y.o. Freddy Finner Primary Care Brixton Schnapp: Darnelle Spangle Other Clinician: Referring Ohn Bostic: Treating [place] Marchiano/Extender: Paulina Fusi Weeks in Treatment: 5 Edema Assessment Left: Right: Assessed: No No Edema: Yes Calf Left: Right: Point of Measurement: 36 cm From Medial Instep 37 cm Ankle Left: Right: Point of Measurement: 11 cm From Medial Instep 24 cm Vascular Assessment Left: Right: Pulses: Dorsalis Pedis Palpable: Yes Electronic Signature(s) Signed: 12/17/2022 12:58:51 PM By: Yevonne Pax RN Entered By: Yevonne Pax on 12/14/2022 11:06:59 -------------------------------------------------------------------------------- Multi Wound Chart Details Patient Name: Date of Service: [name], [name] 12/14/2022 11:00 A M Medical Record Number: [id number] Patient Account Number: [id number] Date of Birth/Sex: Treating RN: Mar 23, 1941 (82 y.o. Freddy Finner Primary Care Phoebe Marter: Darnelle Spangle Other Clinician: Referring Mark Hassey: Treating Ladrea Holladay/Extender: Paulina Fusi Weeks in Treatment: 5 Vital Signs Height(in): 64 Pulse(bpm): 51 Weight(lbs): 190 Blood Pressure(mmHg): 132/72 Body Mass Index(BMI): 32.6 Temperature(F): 98.4 Respiratory Rate(breaths/min): 18 [1:Photos:] [N/A:N/A] Right, Medial Lower Leg N/A N/A Wound Location: Trauma N/A N/A Wounding Event: [name], [name] ([id number]) [id number].pdf Page 5 of 9 Diabetic Wound/Ulcer of the Lower N/A N/A Primary Etiology: Extremity Lymphedema, Hypertension, Type II N/A N/A Comorbid History: Diabetes 10/12/2022 N/A N/A Date  Acquired: 5 N/A N/A Weeks of Treatment: Open N/A N/A Wound Status: No N/A N/A Wound Recurrence: 4.5x2x0.2 N/A N/A Measurements L x W x D (cm) 7.069 N/A N/A A (cm) : rea 1.414 N/A N/A Volume (cm) : 4.30% N/A N/A % Reduction in A rea: 80.80% N/A N/A % Reduction in Volume: Grade 1 N/A N/A Classification: Medium N/A N/A Exudate A mount: Serosanguineous N/A N/A Exudate Type: red, brown N/A N/A Exudate Color: Medium (34-66%) N/A N/A Granulation A mount: Small (1-33%) N/A N/A Necrotic A mount: Fat Layer (Subcutaneous Tissue): Yes N/A N/A Exposed Structures: Fascia: No Tendon: No Muscle: No Joint: No Bone: No None N/A N/A Epithelialization: Treatment Notes Electronic Signature(s) Signed: 12/17/2022 12:58:51 PM By: Yevonne Pax RN Entered By: Yevonne Pax on 12/14/2022 11:07:03 -------------------------------------------------------------------------------- Multi-Disciplinary Care Plan Details Patient Name: Date of Service: [name], Jarissa 12/14/2022 11:00 A M Medical Record Number: [id number] Patient Account Number: [id number] Date of Birth/Sex: Treating RN: 05-Feb-1941 (82 y.o. Freddy Finner Primary Care Guinn Delarosa: Darnelle Spangle Other Clinician: Referring Hayden Kihara: Treating  Dnyla Antonetti/Extender: Paulina Fusi Weeks in Treatment: 5 Active Inactive Wound/Skin Impairment Nursing Diagnoses: Knowledge deficit related to ulceration/compromised skin integrity Goals: Patient/caregiver will verbalize understanding of skin care regimen Date Initiated: 11/09/2022 Target Resolution Date: 01/09/2023 Goal Status: Active Ulcer/skin breakdown will have a volume reduction of 30% by week 4 Date Initiated: 11/09/2022 Date Inactivated: 12/14/2022 Target Resolution Date: 12/10/2022 Goal Status: Unmet Unmet Reason: comorbidities Ulcer/skin breakdown will have a volume reduction of 50% by week 8 Date Initiated: 11/09/2022 Target Resolution Date: 01/09/2023 Goal Status: Active Ulcer/skin breakdown will have a volume reduction of 80% by week 12 Date Initiated: 11/09/2022 Target Resolution Date: 02/09/2023 Goal Status: Active [name], [name] ([id number]) [phone].pdf Page 6 of 9 Ulcer/skin breakdown will heal within 14 weeks Date Initiated: 11/09/2022 Target Resolution Date: 03/12/2023 Goal Status: Active Interventions: Assess patient/caregiver ability to obtain necessary supplies Assess patient/caregiver ability to perform ulcer/skin care regimen upon admission and as needed Assess ulceration(s) every visit Notes: Electronic Signature(s) Signed: 12/17/2022 12:58:51 PM By: Yevonne Pax RN Entered By: Yevonne Pax on 12/14/2022 11:07:33 -------------------------------------------------------------------------------- Pain Assessment Details Patient Name: Date of Service: [name], [name] 12/14/2022 11:00 A M Medical Record Number: [id number] Patient Account Number: [id number] Date of Birth/Sex: Treating RN: 1941/06/10 (82 y.o. Freddy Finner Primary Care Si Jachim: Darnelle Spangle Other Clinician: Referring Danniela Mcbrearty: Treating Alianis Trimmer/Extender: Paulina Fusi Weeks in Treatment: 5 Active Problems Location of Pain Severity and  Description of Pain Patient Has Paino No Site Locations Pain Management and Medication Current Pain Management: Electronic Signature(s) Signed: 12/17/2022 12:58:51 PM By: Yevonne Pax RN Entered By: Yevonne Pax on 12/14/2022 11:00:24 [name] ([id number]) [id number] Page 7 of 9 -------------------------------------------------------------------------------- Patient/Caregiver Education Details Patient Name: Date of Service: [name], [name] 6/17/2024andnbsp11:00 A M Medical Record Number: [id number] Patient Account Number: [id number] Date of Birth/Gender: Treating RN: [birth year] (82 y.o. Freddy Finner Primary Care Physician: Darnelle Spangle Other Clinician: Referring Physician: Treating Physician/Extender: Paulina Fusi Weeks in Treatment: 5 Education Assessment Education Provided To: Patient Education Topics Provided Wound/Skin Impairment: Handouts: Caring for Your Ulcer Methods: Explain/Verbal Responses: State content correctly Electronic Signature(s) Signed: 12/17/2022 12:58:51 PM By: Yevonne Pax RN Entered By: Yevonne Pax on 12/14/2022 11:07:47 -------------------------------------------------------------------------------- Wound Assessment Details Patient Name: Date of Service: [name], [name] 12/14/2022 11:00 A M Medical Record Number: [id number] Patient Account Number: [id number] Date of Birth/Sex: Treating RN: 31-May-1941 (82 y.o. Freddy Finner Primary Care Cady Hafen: Darnelle Spangle Other Clinician: Referring Carsten Carstarphen:  Treating Marg Macmaster/Extender: Paulina Fusi Weeks in Treatment: 5 Wound Status Wound Number: 1 Primary Etiology: Diabetic Wound/Ulcer of the Lower Extremity Wound Location: Right, Medial Lower Leg Wound Status: Open Wounding Event: Trauma Comorbid History: Lymphedema, Hypertension, Type II Diabetes Date Acquired: 10/12/2022 Weeks Of Treatment: 5 Clustered Wound:  No Photos JACQULYNE, CARDOSA ([id number]) [id number] Page 8 of 9 Wound Measurements Length: (cm) 4.5 Width: (cm) 2 Depth: (cm) 0.2 Area: (cm) 7.069 Volume: (cm) 1.414 % Reduction in Area: 4.3% % Reduction in Volume: 80.8% Epithelialization: None Tunneling: No Undermining: No Wound Description Classification: Grade 1 Exudate Amount: Medium Exudate Type: Serosanguineous Exudate Color: red, brown Foul Odor After Cleansing: No Slough/Fibrino Yes Wound Bed Granulation Amount: Medium (34-66%) Exposed Structure Necrotic Amount: Small (1-33%) Fascia Exposed: No Necrotic Quality: Adherent Slough Fat Layer (Subcutaneous Tissue) Exposed: Yes Tendon Exposed: No Muscle Exposed: No Joint Exposed: No Bone Exposed: No Treatment Notes Wound #1 (Lower Leg) Wound Laterality: Right, Medial Cleanser [place]ram Ancillary Kit - 15 Day Supply Discharge Instruction: Use supplies as instructed; Kit contains: (15) Saline Bullets; (15) 3x3 Gauze; 15 pr Gloves Soap and Water Discharge Instruction: Gently cleanse wound with antibacterial soap, rinse and pat dry prior to dressing wounds Wound Cleanser Discharge Instruction: Wash your hands with soap and water. Remove old dressing, discard into plastic bag and place into trash. Cleanse the wound with Wound Cleanser prior to applying a clean dressing using gauze sponges, not tissues or cotton balls. Do not scrub or use excessive force. Pat dry using gauze sponges, not tissue or cotton balls. Peri-Wound Care Topical Primary Dressing Prisma 4.34 (in) Discharge Instruction: Moisten w/normal saline or sterile water; Cover wound as directed. Do not remove from wound bed. Secondary Dressing (BORDER) Zetuvit Plus SILICONE BORDER Dressing 5x5 (in/in) Discharge Instruction: Please do not put silicone bordered dressings under wraps. Use non-bordered dressing only. Secured With Tubigrip Size D, 3x10 (in/yd) Discharge Instruction: double  layer Compression Wrap Compression Stockings Add-Ons Electronic Signature(s) Signed: 12/17/2022 12:58:51 PM By: Yevonne Pax RN Entered By: Yevonne Pax on 12/14/2022 11:06:30 [name] ([id number]) [phone].pdf Page 9 of 9 -------------------------------------------------------------------------------- Vitals Details Patient Name: Date of Service: SHARREE, MICHALAK 12/14/2022 11:00 A M Medical Record Number: [id number] Patient Account Number: [id number] Date of Birth/Sex: Treating RN: Sep 06, 1940 (82 y.o. Freddy Finner Primary Care Orion Mole: Darnelle Spangle Other Clinician: Referring Naif Alabi: Treating Lewis Grivas/Extender: Paulina Fusi Weeks in Treatment: 5 Vital Signs Time Taken: 10:59 Temperature (F): 98.4 Height (in): 64 Pulse (bpm): 51 Weight (lbs): 190 Respiratory Rate (breaths/min): 18 Body Mass Index (BMI): 32.6 Blood Pressure (mmHg): 132/72 Reference Range: 80 - 120 mg / dl Electronic Signature(s) Signed: 12/17/2022 12:58:51 PM By: Yevonne Pax RN Entered By: Yevonne Pax on 12/14/2022 11:00:12

## 2022-12-21 ENCOUNTER — Ambulatory Visit: Payer: Medicare PPO | Admitting: Physician Assistant

## 2022-12-22 ENCOUNTER — Encounter: Payer: Medicare PPO | Admitting: Physician Assistant

## 2022-12-22 DIAGNOSIS — E11622 Type 2 diabetes mellitus with other skin ulcer: Secondary | ICD-10-CM | POA: Diagnosis not present

## 2022-12-22 NOTE — Progress Notes (Addendum)
TZIPPORAH, NAGORSKI ([id number]) [id number] Page 1 of 9 Visit Report for 12/22/2022 Arrival Information Details Patient Name: Date of Service: [name], [name] 12/22/2022 11:00 A M Medical Record Number: [id number] Patient Account Number: [id number] Date of Birth/Sex: Treating RN: August 13, 1940 (82 y.o. Freddy Finner Primary Care Tawfiq Favila: Darnelle Spangle Other Clinician: Referring Lennon Boutwell: Treating Mosetta Ferdinand/Extender: Paulina Fusi Weeks in Treatment: 6 Visit Information History Since Last Visit Added or deleted any medications: No Patient Arrived: Ambulatory Any new allergies or adverse reactions: No Arrival Time: 11:16 Had a fall or experienced change in No Accompanied By: self activities of daily living that may affect Transfer Assistance: None risk of falls: Patient Identification Verified: Yes Signs or symptoms of abuse/neglect since last visito No Secondary Verification Process Completed: Yes Hospitalized since last visit: No Patient Requires Transmission-Based Precautions: No Implantable device outside of the clinic excluding No Patient Has Alerts: No cellular tissue based products placed in the center since last visit: Has Dressing in Place as Prescribed: Yes Has Compression in Place as Prescribed: Yes Pain Present Now: No Electronic Signature(s) Signed: 12/23/2022 11:52:51 AM By: Yevonne Pax RN Entered By: Yevonne Pax on 12/22/2022 11:17:18 -------------------------------------------------------------------------------- Clinic Level of Care Assessment Details Patient Name: Date of Service: DEARRA, MYHAND 12/22/2022 11:00 A M Medical Record Number: [id number] Patient Account Number: [id number] Date of Birth/Sex: Treating RN: 29-Oct-1940 (82 y.o. Freddy Finner Primary Care Rodrigus Kilker: Darnelle Spangle Other Clinician: Referring Karalina Tift: Treating Valynn Schamberger/Extender: Paulina Fusi Weeks in Treatment: 6 Clinic Level  of Care Assessment Items TOOL 4 Quantity Score X- 1 0 Use when only an EandM is performed on FOLLOW-UP visit ASSESSMENTS - Nursing Assessment / Reassessment X- 1 10 Reassessment of Co-morbidities (includes updates in patient status) X- 1 5 Reassessment of Adherence to Treatment Plan [name], [name] ([id number]) [id number] Page 2 of 9 ASSESSMENTS - Wound and Skin A ssessment / Reassessment X - Simple Wound Assessment / Reassessment - one wound 1 5 []  - 0 Complex Wound Assessment / Reassessment - multiple wounds []  - 0 Dermatologic / Skin Assessment (not related to wound area) ASSESSMENTS - Focused Assessment []  - 0 Circumferential Edema Measurements - multi extremities []  - 0 Nutritional Assessment / Counseling / Intervention []  - 0 Lower Extremity Assessment (monofilament, tuning fork, pulses) []  - 0 Peripheral Arterial Disease Assessment (using hand held doppler) ASSESSMENTS - Ostomy and/or Continence Assessment and Care []  - 0 Incontinence Assessment and Management []  - 0 Ostomy Care Assessment and Management (repouching, etc.) PROCESS - Coordination of Care X - Simple Patient / Family Education for ongoing care 1 15 []  - 0 Complex (extensive) Patient / Family Education for ongoing care []  - 0 Staff obtains Chiropractor, Records, T Results / Process Orders est []  - 0 Staff telephones HHA, Nursing Homes / Clarify orders / etc []  - 0 Routine Transfer to another Facility (non-emergent condition) []  - 0 Routine Hospital Admission (non-emergent condition) []  - 0 New Admissions / Manufacturing engineer / Ordering NPWT Apligraf, etc. , []  - 0 Emergency Hospital Admission (emergent condition) X- 1 10 Simple Discharge Coordination []  - 0 Complex (extensive) Discharge Coordination PROCESS - Special Needs []  - 0 Pediatric / Minor Patient Management []  - 0 Isolation Patient Management []  - 0 Hearing / Language / Visual special needs []  -  0 Assessment of Community assistance (transportation, D/C planning, etc.) []  - 0 Additional assistance / Altered mentation []  - 0 Support Surface(s) Assessment (bed, cushion, seat, etc.) INTERVENTIONS - Wound Cleansing / Measurement X -  Simple Wound Cleansing - one wound 1 5 []  - 0 Complex Wound Cleansing - multiple wounds X- 1 5 Wound Imaging (photographs - any number of wounds) []  - 0 Wound Tracing (instead of photographs) X- 1 5 Simple Wound Measurement - one wound []  - 0 Complex Wound Measurement - multiple wounds INTERVENTIONS - Wound Dressings X - Small Wound Dressing one or multiple wounds 1 10 []  - 0 Medium Wound Dressing one or multiple wounds []  - 0 Large Wound Dressing one or multiple wounds []  - 0 Application of Medications - topical []  - 0 Application of Medications - injection INTERVENTIONS - Miscellaneous []  - 0 External ear exam SOMAYA, GRASSI ([id number]) [id number] Page 3 of 9 []  - 0 Specimen Collection (cultures, biopsies, blood, body fluids, etc.) []  - 0 Specimen(s) / Culture(s) sent or taken to Lab for analysis []  - 0 Patient Transfer (multiple staff / Michiel Sites Lift / Similar devices) []  - 0 Simple Staple / Suture removal (25 or less) []  - 0 Complex Staple / Suture removal (26 or more) []  - 0 Hypo / Hyperglycemic Management (close monitor of Blood Glucose) []  - 0 Ankle / Brachial Index (ABI) - do not check if billed separately X- 1 5 Vital Signs Has the patient been seen at the hospital within the last three years: Yes Total Score: 75 Level Of Care: New/Established - Level 2 Electronic Signature(s) Signed: 12/23/2022 11:52:51 AM By: Yevonne Pax RN Entered By: Yevonne Pax on 12/22/2022 12:52:56 -------------------------------------------------------------------------------- Encounter Discharge Information Details Patient Name: Date of Service: [name], [name] 12/22/2022 11:00 A M Medical Record Number:  [id number] Patient Account Number: [id number] Date of Birth/Sex: Treating RN: 08/28/1940 (82 y.o. Freddy Finner Primary Care Saylor Sheckler: Darnelle Spangle Other Clinician: Referring Luberta Grabinski: Treating Aldyn Toon/Extender: Paulina Fusi Weeks in Treatment: 6 Encounter Discharge Information Items Discharge Condition: Stable Ambulatory Status: Cane Discharge Destination: Home Transportation: Private Auto Accompanied By: self Schedule Follow-up Appointment: Yes Clinical Summary of Care: Electronic Signature(s) Signed: 12/22/2022 12:54:09 PM By: Yevonne Pax RN Entered By: Yevonne Pax on 12/22/2022 12:54:09 Lower Extremity Assessment Details -------------------------------------------------------------------------------- [name] ([id number]) [id number] Page 4 of 9 Patient Name: Date of Service: [name], [name] 12/22/2022 11:00 A M Medical Record Number: [id number] Patient Account Number: [id number] Date of Birth/Sex: Treating RN: 27-Jun-1941 (82 y.o. Freddy Finner Primary Care Remmy Riffe: Darnelle Spangle Other Clinician: Referring Kato Wieczorek: Treating Jessice Madill/Extender: Paulina Fusi Weeks in Treatment: 6 Edema Assessment Left: Right: Assessed: No No Edema: No Calf Left: Right: Point of Measurement: 36 cm From Medial Instep 37 cm Ankle Left: Right: Point of Measurement: 11 cm From Medial Instep 24 cm Vascular Assessment Left: Right: Pulses: Dorsalis Pedis Palpable: Yes Electronic Signature(s) Signed: 12/23/2022 11:52:51 AM By: Yevonne Pax RN Entered By: Yevonne Pax on 12/22/2022 11:23:48 -------------------------------------------------------------------------------- Multi Wound Chart Details Patient Name: Date of Service: [name], Belita 12/22/2022 11:00 A M Medical Record Number: [id number] Patient Account Number: [id number] Date of Birth/Sex: Treating RN: 02/15/1941 (82 y.o. Freddy Finner Primary Care  Krithika Tome: Darnelle Spangle Other Clinician: Referring Sesar Madewell: Treating Mariadelaluz Guggenheim/Extender: Paulina Fusi Weeks in Treatment: 6 Vital Signs Height(in): 64 Pulse(bpm): 61 Weight(lbs): 190 Blood Pressure(mmHg): 145/82 Body Mass Index(BMI): 32.6 Temperature(F): 98 Respiratory Rate(breaths/min): 18 [1:Photos:] [N/A:N/A] Right, Medial Lower Leg N/A N/A Wound Location: Trauma N/A N/A Wounding Event: [name], [name] ([id number]) [id number] Page 5 of 9 Diabetic Wound/Ulcer of the Lower N/A N/A Primary Etiology: Extremity Lymphedema, Hypertension, Type II N/A N/A Comorbid History: Diabetes 10/12/2022 N/A N/A Date  Acquired: 6 N/A N/A Weeks of Treatment: Open N/A N/A Wound Status: No N/A N/A Wound Recurrence: 4x2x0.2 N/A N/A Measurements L x W x D (cm) 6.283 N/A N/A A (cm) : rea 1.257 N/A N/A Volume (cm) : 14.90% N/A N/A % Reduction in A rea: 83.00% N/A N/A % Reduction in Volume: Grade 1 N/A N/A Classification: Medium N/A N/A Exudate A mount: Serosanguineous N/A N/A Exudate Type: red, brown N/A N/A Exudate Color: Large (67-100%) N/A N/A Granulation A mount: None Present (0%) N/A N/A Necrotic A mount: Fat Layer (Subcutaneous Tissue): Yes N/A N/A Exposed Structures: Fascia: No Tendon: No Muscle: No Joint: No Bone: No None N/A N/A Epithelialization: Treatment Notes Electronic Signature(s) Signed: 12/23/2022 11:52:51 AM By: Yevonne Pax RN Entered By: Yevonne Pax on 12/22/2022 11:23:53 -------------------------------------------------------------------------------- Multi-Disciplinary Care Plan Details Patient Name: Date of Service: [name], Aeon 12/22/2022 11:00 A M Medical Record Number: [id number] Patient Account Number: [id number] Date of Birth/Sex: Treating RN: [birth year] (82 y.o. Freddy Finner Primary Care Kariyah Baugh: Darnelle Spangle Other Clinician: Referring Zamyah Wiesman: Treating Dontasia Miranda/Extender: Paulina Fusi Weeks in Treatment: 6 Active Inactive Wound/Skin Impairment Nursing Diagnoses: Knowledge deficit related to ulceration/compromised skin integrity Goals: Patient/caregiver will verbalize understanding of skin care regimen Date Initiated: 11/09/2022 Target Resolution Date: 01/09/2023 Goal Status: Active Ulcer/skin breakdown will have a volume reduction of 30% by week 4 Date Initiated: 11/09/2022 Date Inactivated: 12/14/2022 Target Resolution Date: 12/10/2022 Goal Status: Unmet Unmet Reason: comorbidities Ulcer/skin breakdown will have a volume reduction of 50% by week 8 Date Initiated: 11/09/2022 Target Resolution Date: 01/09/2023 Goal Status: Active Ulcer/skin breakdown will have a volume reduction of 80% by week 12 Date Initiated: 11/09/2022 Target Resolution Date: 02/09/2023 Goal Status: Active [name], [name] ([id number]) [id number] Page 6 of 9 Ulcer/skin breakdown will heal within 14 weeks Date Initiated: 11/09/2022 Target Resolution Date: 03/12/2023 Goal Status: Active Interventions: Assess patient/caregiver ability to obtain necessary supplies Assess patient/caregiver ability to perform ulcer/skin care regimen upon admission and as needed Assess ulceration(s) every visit Notes: Electronic Signature(s) Signed: 12/22/2022 12:53:17 PM By: Yevonne Pax RN Entered By: Yevonne Pax on 12/22/2022 12:53:16 -------------------------------------------------------------------------------- Pain Assessment Details Patient Name: Date of Service: [name], [name] 12/22/2022 11:00 A M Medical Record Number: [id number] Patient Account Number: [id number] Date of Birth/Sex: Treating RN: [birth year] (82 y.o. Freddy Finner Primary Care Lidia Clavijo: Darnelle Spangle Other Clinician: Referring Keiondra Brookover: Treating Deshanda Molitor/Extender: Paulina Fusi Weeks in Treatment: 6 Active Problems Location of Pain Severity and Description of  Pain Patient Has Paino No Site Locations Pain Management and Medication Current Pain Management: Electronic Signature(s) Signed: 12/23/2022 11:52:51 AM By: Yevonne Pax RN Entered By: Yevonne Pax on 12/22/2022 11:17:46 [name] ([id number]) [id number] Page 7 of 9 -------------------------------------------------------------------------------- Patient/Caregiver Education Details Patient Name: Date of Service: [name], [name] 6/25/2024andnbsp11:00 A M Medical Record Number: [id number] Patient Account Number: [id number] Date of Birth/Gender: Treating RN: 1941-06-29 (82 y.o. Freddy Finner Primary Care Physician: Darnelle Spangle Other Clinician: Referring Physician: Treating Physician/Extender: Paulina Fusi Weeks in Treatment: 6 Education Assessment Education Provided To: Patient Education Topics Provided Wound Debridement: Handouts: Wound Debridement Methods: Explain/Verbal Responses: State content correctly Electronic Signature(s) Signed: 12/23/2022 11:52:51 AM By: Yevonne Pax RN Entered By: Yevonne Pax on 12/22/2022 11:24:16 -------------------------------------------------------------------------------- Wound Assessment Details Patient Name: Date of Service: [name], [name] 12/22/2022 11:00 A M Medical Record Number: [id number] Patient Account Number: [id number] Date of Birth/Sex: Treating RN: 04/16/1941 (82 y.o. Freddy Finner Primary Care Clemma Johnsen: Darnelle Spangle Other Clinician: Referring Winford Hehn: Treating  Johanny Segers/Extender: Paulina Fusi Weeks in Treatment: 6 Wound Status Wound Number: 1 Primary Etiology: Diabetic Wound/Ulcer of the Lower Extremity Wound Location: Right, Medial Lower Leg Wound Status: Open Wounding Event: Trauma Comorbid History: Lymphedema, Hypertension, Type II Diabetes Date Acquired: 10/12/2022 Weeks Of Treatment: 6 Clustered Wound: No Photos [name], [name]  ([id number]) [id number] Page 8 of 9 Wound Measurements Length: (cm) 4 Width: (cm) 2 Depth: (cm) 0.2 Area: (cm) 6.283 Volume: (cm) 1.257 % Reduction in Area: 14.9% % Reduction in Volume: 83% Epithelialization: None Tunneling: No Undermining: No Wound Description Classification: Grade 1 Exudate Amount: Medium Exudate Type: Serosanguineous Exudate Color: red, brown Foul Odor After Cleansing: No Slough/Fibrino No Wound Bed Granulation Amount: Large (67-100%) Exposed Structure Necrotic Amount: None Present (0%) Fascia Exposed: No Fat Layer (Subcutaneous Tissue) Exposed: Yes Tendon Exposed: No Muscle Exposed: No Joint Exposed: No Bone Exposed: No Treatment Notes Wound #1 (Lower Leg) Wound Laterality: Right, Medial Cleanser [place] Ancillary Kit - 15 Day Supply Discharge Instruction: Use supplies as instructed; Kit contains: (15) Saline Bullets; (15) 3x3 Gauze; 15 pr Gloves Soap and Water Discharge Instruction: Gently cleanse wound with antibacterial soap, rinse and pat dry prior to dressing wounds Wound Cleanser Discharge Instruction: Wash your hands with soap and water. Remove old dressing, discard into plastic bag and place into trash. Cleanse the wound with Wound Cleanser prior to applying a clean dressing using gauze sponges, not tissues or cotton balls. Do not scrub or use excessive force. Pat dry using gauze sponges, not tissue or cotton balls. Peri-Wound Care Topical Primary Dressing Prisma 4.34 (in) Discharge Instruction: Moisten w/normal saline or sterile water; Cover wound as directed. Do not remove from wound bed. Secondary Dressing (BORDER) Zetuvit Plus SILICONE BORDER Dressing 5x5 (in/in) Discharge Instruction: Please do not put silicone bordered dressings under wraps. Use non-bordered dressing only. Secured With Tubigrip Size D, 3x10 (in/yd) Discharge Instruction: double layer Compression Wrap Compression  Stockings Add-Ons Electronic Signature(s) Signed: 12/23/2022 11:52:51 AM By: Yevonne Pax RN Entered By: Yevonne Pax on 12/22/2022 11:23:28 [name] ([id number]) [id number] Page 9 of 9 -------------------------------------------------------------------------------- Vitals Details Patient Name: Date of Service: [name], [name] 12/22/2022 11:00 A M Medical Record Number: [id number] Patient Account Number: [id number] Date of Birth/Sex: Treating RN: [birth year] (82 y.o. Freddy Finner Primary Care Eldana Isip: Darnelle Spangle Other Clinician: Referring Hildreth Orsak: Treating Eowyn Tabone/Extender: Paulina Fusi Weeks in Treatment: 6 Vital Signs Time Taken: 11:17 Temperature (F): 98 Height (in): 64 Pulse (bpm): 61 Weight (lbs): 190 Respiratory Rate (breaths/min): 18 Body Mass Index (BMI): 32.6 Blood Pressure (mmHg): 145/82 Reference Range: 80 - 120 mg / dl Electronic Signature(s) Signed: 12/23/2022 11:52:51 AM By: Yevonne Pax RN Entered By: Yevonne Pax on 12/22/2022 11:17:39

## 2022-12-22 NOTE — Progress Notes (Addendum)
Kristie, [name] ([id number]) [id number] Page 1 of 6 Visit Report for 12/22/2022 Chief Complaint Document Details Patient Name: Date of Service: [name], MESLER 12/22/2022 11:00 A M Medical Record Number: [id number] Patient Account Number: [id number] Date of Birth/Sex: Treating RN: 03-23-1941 (82 y.o. Freddy Finner Primary Care Provider: Darnelle Spangle Other Clinician: Referring Provider: Treating Provider/Extender: Paulina Fusi Weeks in Treatment: 6 Information Obtained from: Patient Chief Complaint Right LE Ulcer Electronic Signature(s) Signed: 12/22/2022 11:04:18 AM By: Allen Derry PA-C Entered By: Allen Derry on 12/22/2022 11:04:18 -------------------------------------------------------------------------------- HPI Details Patient Name: Date of Service: Kristie, [name] 12/22/2022 11:00 A M Medical Record Number: [id number] Patient Account Number: [id number] Date of Birth/Sex: Treating RN: 03/24/1941 (82 y.o. Freddy Finner Primary Care Provider: Darnelle Spangle Other Clinician: Referring Provider: Treating Provider/Extender: Paulina Fusi Weeks in Treatment: 6 History of Present Illness HPI Description: 11-09-2022 upon evaluation today patient appears to be doing somewhat poorly in regard to her wound on her right medial lower extremity. This occurred as a result of the trauma initially which I think turned into more of an abscess. This does have some tracking underneath she has 3 openings all which are part of the same wound. I am unsure if this is going end up opening up into the entire region or not but time will tell as far as that is concerned. With that being said right now she has been on doxycycline earlier in April she is out of the mupirocin at this point. With that being said I think she may need to be back on the doxycycline in order to ensure especially after I cleaned this up today that we do not worsen  anything from an infection standpoint. Patient does have a history of chronic venous hypertension, hypertension, and diabetes mellitus type 2. Her most recent hemoglobin A1c was 5.9 on October 20, 2022 11-16-2022 upon evaluation today patient appears to be doing better in regard to her wound which is actually slowly started to look improved as far as the overall appearance of the wound bed is concerned. Fortunately I do not see any signs of active infection locally or systemically which is great news and overall I am extremely pleased with where things stand currently. 11-24-2022 upon evaluation today patient appears to be doing well currently in regard to her leg ulcer. She has been making some progress here. Some of the skin is thinned out between the openings and I think we probably need to open this up some of these more confluent area versus the spider openings in multiple areas. [name], [name] ([id number]) [id number] Page 2 of 6 11-30-2022 upon evaluation today patient appears to be doing well currently in regard to her wound she has been tolerating the dressing changes without complication. With that being said I do think that we are making pretty good progress here which is great news. I do not see any evidence of active infection locally nor systemically which is excellent as well. 12-07-2022 upon evaluation today patient actually appears to be making some great improvements here in regard to her wound. In fact this is showing signs of good granulation and there is really no need for sharp debridement today which is great news as well. 12-14-2022 upon evaluation today patient appears to be doing well currently in regard to her wounds she is actually showing some signs of improvement and I am very pleased in that regard. The undermining areas and tunneling is actually dramatically improved compared to what it  was. 12-22-2022 upon evaluation today patient appears to be  doing well currently in regard to her wounds which are actually showing signs of significant improvement. I am actually very pleased with where we stand I think she is making great progress. Electronic Signature(s) Signed: 12/22/2022 12:39:12 PM By: Allen Derry PA-C Entered By: Allen Derry on 12/22/2022 12:39:12 -------------------------------------------------------------------------------- Physical Exam Details Patient Name: Date of Service: Kristie, [name] 12/22/2022 11:00 A M Medical Record Number: [id number] Patient Account Number: [id number] Date of Birth/Sex: Treating RN: 12-Oct-1940 (82 y.o. Freddy Finner Primary Care Provider: Darnelle Spangle Other Clinician: Referring Provider: Treating Provider/Extender: Paulina Fusi Weeks in Treatment: 6 Constitutional Well-nourished and well-hydrated in no acute distress. Respiratory normal breathing without difficulty. Psychiatric this patient is able to make decisions and demonstrates good insight into disease process. Alert and Oriented x 3. pleasant and cooperative. Notes Upon inspection patient's wound bed actually showed signs of good granulation epithelization at this point. Fortunately I do not see any evidence of worsening overall and in general I think that we are making some headway towards closure. Electronic Signature(s) Signed: 12/22/2022 12:39:28 PM By: Allen Derry PA-C Entered By: Allen Derry on 12/22/2022 12:39:28 -------------------------------------------------------------------------------- Physician Orders Details Patient Name: Date of Service: Kristie, [name] 12/22/2022 11:00 A M Medical Record Number: [id number] Patient Account Number: [id number] Date of Birth/Sex: Treating RN: 07-11-1940 (82 y.o. Freddy Finner Primary Care Provider: Darnelle Spangle Other Clinician: Peggyann Juba ([id number]) [id number] Page 3 of 6 Referring Provider: Treating Provider/Extender:  Paulina Fusi Weeks in Treatment: 6 Verbal / Phone Orders: No Diagnosis Coding ICD-10 Coding Code Description E11.622 Type 2 diabetes mellitus with other skin ulcer I87.331 Chronic venous hypertension (idiopathic) with ulcer and inflammation of right lower extremity L97.812 Non-pressure chronic ulcer of other part of right lower leg with fat layer exposed I10 Essential (primary) hypertension Follow-up Appointments Return Appointment in 1 week. Bathing/ Shower/ Hygiene May shower with wound dressing protected with water repellent cover or cast protector. No tub bath. Anesthetic (Use 'Patient Medications' Section for Anesthetic Order Entry) Lidocaine applied to wound bed Edema Control - Lymphedema / Segmental Compressive Device / Other Elevate, Exercise Daily and A void Standing for Long Periods of Time. Elevate legs to the level of the heart and pump ankles as often as possible Elevate leg(s) parallel to the floor when sitting. Wound Treatment Wound #1 - Lower Leg Wound Laterality: Right, Medial Cleanser: [employer]ram Ancillary Kit - 15 Day Supply (Generic) 3 x Per Week/30 Days Discharge Instructions: Use supplies as instructed; Kit contains: (15) Saline Bullets; (15) 3x3 Gauze; 15 pr Gloves Cleanser: Soap and Water 3 x Per Week/30 Days Discharge Instructions: Gently cleanse wound with antibacterial soap, rinse and pat dry prior to dressing wounds Cleanser: Wound Cleanser (Generic) 3 x Per Week/30 Days Discharge Instructions: Wash your hands with soap and water. Remove old dressing, discard into plastic bag and place into trash. Cleanse the wound with Wound Cleanser prior to applying a clean dressing using gauze sponges, not tissues or cotton balls. Do not scrub or use excessive force. Pat dry using gauze sponges, not tissue or cotton balls. Prim Dressing: Prisma 4.34 (in) 3 x Per Week/30 Days ary Discharge Instructions: Moisten w/normal saline or sterile water; Cover wound as  directed. Do not remove from wound bed. Secondary Dressing: (BORDER) Zetuvit Plus SILICONE BORDER Dressing 5x5 (in/in) (Generic) 3 x Per Week/30 Days Discharge Instructions: Please do not put silicone bordered dressings under wraps. Use non-bordered dressing only. Secured  With: Tubigrip Size D, 3x10 (in/yd) 3 x Per Week/30 Days Discharge Instructions: double layer Electronic Signature(s) Signed: 12/22/2022 12:52:33 PM By: Yevonne Pax RN Signed: 12/22/2022 5:11:00 PM By: Allen Derry PA-C Entered By: Yevonne Pax on 12/22/2022 12:52:33 -------------------------------------------------------------------------------- Problem List Details Patient Name: Date of Service: [name], Lily 12/22/2022 11:00 A M Medical Record Number: [id number] Patient Account Number: [id number] Date of Birth/Sex: Treating RN: [birth year] (82 y.o. Freddy Finner [place], [place] ([id number]) [id number] Page 4 of 6 Primary Care Provider: Darnelle Spangle Other Clinician: Referring Provider: Treating Provider/Extender: Paulina Fusi Weeks in Treatment: 6 Active Problems ICD-10 Encounter Code Description Active Date MDM Diagnosis E11.622 Type 2 diabetes mellitus with other skin ulcer 11/09/2022 No Yes I87.331 Chronic venous hypertension (idiopathic) with ulcer and inflammation of right 11/09/2022 No Yes lower extremity L97.812 Non-pressure chronic ulcer of other part of right lower leg with fat layer 11/09/2022 No Yes exposed I10 Essential (primary) hypertension 11/09/2022 No Yes Inactive Problems Resolved Problems Electronic Signature(s) Signed: 12/22/2022 11:04:15 AM By: Allen Derry PA-C Entered By: Allen Derry on 12/22/2022 11:04:15 -------------------------------------------------------------------------------- Progress Note Details Patient Name: Date of Service: MENUCHA, DICESARE 12/22/2022 11:00 A M Medical Record Number: [id number] Patient Account Number:  [id number] Date of Birth/Sex: Treating RN: 1941-04-21 (82 y.o. Freddy Finner Primary Care Provider: Darnelle Spangle Other Clinician: Referring Provider: Treating Provider/Extender: Paulina Fusi Weeks in Treatment: 6 Subjective Chief Complaint Information obtained from Patient Right LE Ulcer History of Present Illness (HPI) 11-09-2022 upon evaluation today patient appears to be doing somewhat poorly in regard to her wound on her right medial lower extremity. This occurred as a result of the trauma initially which I think turned into more of an abscess. This does have some tracking underneath she has 3 openings all which are part of the same wound. I am unsure if this is going end up opening up into the entire region or not but time will tell as far as that is concerned. With that being said right now she has been on doxycycline earlier in April she is out of the mupirocin at this point. With that being said I think she may need to be back on the doxycycline in order to ensure especially after I cleaned this up today that we do not worsen anything from an infection standpoint. Patient does have a history of chronic venous hypertension, hypertension, and diabetes mellitus type 2. Her most recent hemoglobin A1c was 5.9 on October 20, 2022 11-16-2022 upon evaluation today patient appears to be doing better in regard to her wound which is actually slowly started to look improved as far as the [place], Wynona Canes ([id number]) [id number] Page 5 of 6 overall appearance of the wound bed is concerned. Fortunately I do not see any signs of active infection locally or systemically which is great news and overall I am extremely pleased with where things stand currently. 11-24-2022 upon evaluation today patient appears to be doing well currently in regard to her leg ulcer. She has been making some progress here. Some of the skin is thinned out between the openings and I  think we probably need to open this up some of these more confluent area versus the spider openings in multiple areas. 11-30-2022 upon evaluation today patient appears to be doing well currently in regard to her wound she has been tolerating the dressing changes without complication. With that being said I do think that we are making pretty good progress here which is great news. I  do not see any evidence of active infection locally nor systemically which is excellent as well. 12-07-2022 upon evaluation today patient actually appears to be making some great improvements here in regard to her wound. In fact this is showing signs of good granulation and there is really no need for sharp debridement today which is great news as well. 12-14-2022 upon evaluation today patient appears to be doing well currently in regard to her wounds she is actually showing some signs of improvement and I am very pleased in that regard. The undermining areas and tunneling is actually dramatically improved compared to what it was. 12-22-2022 upon evaluation today patient appears to be doing well currently in regard to her wounds which are actually showing signs of significant improvement. I am actually very pleased with where we stand I think she is making great progress. Objective Constitutional Well-nourished and well-hydrated in no acute distress. Vitals Time Taken: 11:17 AM, Height: 64 in, Weight: 190 lbs, BMI: 32.6, Temperature: 98 F, Pulse: 61 bpm, Respiratory Rate: 18 breaths/min, Blood Pressure: 145/82 mmHg. Respiratory normal breathing without difficulty. Psychiatric this patient is able to make decisions and demonstrates good insight into disease process. Alert and Oriented x 3. pleasant and cooperative. General Notes: Upon inspection patient's wound bed actually showed signs of good granulation epithelization at this point. Fortunately I do not see any evidence of worsening overall and in general I think that we  are making some headway towards closure. Integumentary (Hair, Skin) Wound #1 status is Open. Original cause of wound was Trauma. The date acquired was: 10/12/2022. The wound has been in treatment 6 weeks. The wound is located on the Right,Medial Lower Leg. The wound measures 4cm length x 2cm width x 0.2cm depth; 6.283cm^2 area and 1.257cm^3 volume. There is Fat Layer (Subcutaneous Tissue) exposed. There is no tunneling or undermining noted. There is a medium amount of serosanguineous drainage noted. There is large (67-100%) granulation within the wound bed. There is no necrotic tissue within the wound bed. Assessment Active Problems ICD-10 Type 2 diabetes mellitus with other skin ulcer Chronic venous hypertension (idiopathic) with ulcer and inflammation of right lower extremity Non-pressure chronic ulcer of other part of right lower leg with fat layer exposed Essential (primary) hypertension Plan 1. Based on what I see I do not see any evidence of worsening overall at this time in fact he seems to be doing much better and I am very pleased with where we stand currently. 2. I would recommend that we have the patient continue to monitor for any signs of infection or worsening. Obviously based on what I am seeing I do believe that we are making good headway towards complete closure. We will see patient back for reevaluation in 1 week here in the clinic. If anything worsens or changes patient will contact our office for additional recommendations. Electronic Signature(s) Signed: 12/22/2022 12:39:54 PM By: Allen Derry PA-C Entered By: Allen Derry on 12/22/2022 12:39:54 Peggyann Juba ([id number]) [id number] Page 6 of 6 -------------------------------------------------------------------------------- SuperBill Details Patient Name: Date of Service: LOU, IRIGOYEN 12/22/2022 Medical Record Number: [id number] Patient Account Number: [id number] Date of Birth/Sex:  Treating RN: 1941/06/14 (82 y.o. Freddy Finner Primary Care Provider: Darnelle Spangle Other Clinician: Referring Provider: Treating Provider/Extender: Paulina Fusi Weeks in Treatment: 6 Diagnosis Coding ICD-10 Codes Code Description E11.622 Type 2 diabetes mellitus with other skin ulcer I87.331 Chronic venous hypertension (idiopathic) with ulcer and inflammation of right lower extremity L97.812 Non-pressure chronic ulcer of other part of  right lower leg with fat layer exposed I10 Essential (primary) hypertension Facility Procedures : CPT4 Code: [id number] Description: [id number] - WOUND CARE VISIT-LEV 2 EST PT Modifier: Quantity: 1 Physician Procedures : CPT4 Code Description Modifier [id number] - WC PHYS LEVEL 3 - EST PT ICD-10 Diagnosis Description E11.622 Type 2 diabetes mellitus with other skin ulcer I87.331 Chronic venous hypertension (idiopathic) with ulcer and inflammation of right lower  extremity L97.812 Non-pressure chronic ulcer of other part of right lower leg with fat layer exposed I10 Essential (primary) hypertension Quantity: 1 Electronic Signature(s) Signed: 12/22/2022 12:53:07 PM By: Yevonne Pax RN Signed: 12/22/2022 5:11:00 PM By: Allen Derry PA-C Previous Signature: 12/22/2022 12:42:25 PM Version By: Allen Derry PA-C Entered By: Yevonne Pax on 12/22/2022 12:53:07

## 2022-12-29 ENCOUNTER — Encounter: Payer: Medicare PPO | Attending: Physician Assistant | Admitting: Physician Assistant

## 2022-12-29 DIAGNOSIS — I10 Essential (primary) hypertension: Secondary | ICD-10-CM | POA: Diagnosis not present

## 2022-12-29 DIAGNOSIS — I87331 Chronic venous hypertension (idiopathic) with ulcer and inflammation of right lower extremity: Secondary | ICD-10-CM | POA: Insufficient documentation

## 2022-12-29 DIAGNOSIS — L97812 Non-pressure chronic ulcer of other part of right lower leg with fat layer exposed: Secondary | ICD-10-CM | POA: Diagnosis not present

## 2022-12-29 DIAGNOSIS — E11622 Type 2 diabetes mellitus with other skin ulcer: Secondary | ICD-10-CM | POA: Insufficient documentation

## 2022-12-29 DIAGNOSIS — E1151 Type 2 diabetes mellitus with diabetic peripheral angiopathy without gangrene: Secondary | ICD-10-CM | POA: Diagnosis not present

## 2022-12-29 NOTE — Progress Notes (Addendum)
Kristie, [name] ([id number]) [id number] Page 1 of 7 Visit Report for 12/29/2022 Chief Complaint Document Details Patient Name: Date of Service: [name], [name] 12/29/2022 1:30 PM Medical Record Number: [id number] Patient Account Number: [id number] Date of Birth/Sex: Treating RN: [birth year] (82 y.o. Freddy Finner Primary Care Provider: Darnelle Spangle Other Clinician: Referring Provider: Treating Provider/Extender: Paulina Fusi Weeks in Treatment: 7 Information Obtained from: Patient Chief Complaint Right LE Ulcer Electronic Signature(s) Signed: 12/29/2022 1:50:22 PM By: Allen Derry PA-C Entered By: Allen Derry on 12/29/2022 13:50:22 -------------------------------------------------------------------------------- HPI Details Patient Name: Date of Service: [name], [name] 12/29/2022 1:30 PM Medical Record Number: [id number] Patient Account Number: [id number] Date of Birth/Sex: Treating RN: December 03, 1940 (82 y.o. Freddy Finner Primary Care Provider: Darnelle Spangle Other Clinician: Referring Provider: Treating Provider/Extender: Paulina Fusi Weeks in Treatment: 7 History of Present Illness HPI Description: 11-09-2022 upon evaluation today patient appears to be doing somewhat poorly in regard to her wound on her right medial lower extremity. This occurred as a result of the trauma initially which I think turned into more of an abscess. This does have some tracking underneath she has 3 openings all which are part of the same wound. I am unsure if this is going end up opening up into the entire region or not but time will tell as far as that is concerned. With that being said right now she has been on doxycycline earlier in April she is out of the mupirocin at this point. With that being said I think she may need to be back on the doxycycline in order to ensure especially after I cleaned this up today that we do not worsen anything  from an infection standpoint. Patient does have a history of chronic venous hypertension, hypertension, and diabetes mellitus type 2. Her most recent hemoglobin A1c was 5.9 on October 20, 2022 11-16-2022 upon evaluation today patient appears to be doing better in regard to her wound which is actually slowly started to look improved as far as the overall appearance of the wound bed is concerned. Fortunately I do not see any signs of active infection locally or systemically which is great news and overall I am extremely pleased with where things stand currently. 11-24-2022 upon evaluation today patient appears to be doing well currently in regard to her leg ulcer. She has been making some progress here. Some of the skin is thinned out between the openings and I think we probably need to open this up some of these more confluent area versus the spider openings in multiple areas. Kristie, [name] ([id number]) [id number] Page 2 of 7 11-30-2022 upon evaluation today patient appears to be doing well currently in regard to her wound she has been tolerating the dressing changes without complication. With that being said I do think that we are making pretty good progress here which is great news. I do not see any evidence of active infection locally nor systemically which is excellent as well. 12-07-2022 upon evaluation today patient actually appears to be making some great improvements here in regard to her wound. In fact this is showing signs of good granulation and there is really no need for sharp debridement today which is great news as well. 12-14-2022 upon evaluation today patient appears to be doing well currently in regard to her wounds she is actually showing some signs of improvement and I am very pleased in that regard. The undermining areas and tunneling is actually dramatically improved compared to what it was. 12-22-2022  upon evaluation today patient appears to be doing  well currently in regard to her wounds which are actually showing signs of significant improvement. I am actually very pleased with where we stand I think she is making great progress. 12-29-2022 upon evaluation today patient appears to be doing well currently in regard to her wound in fact this is showing signs of excellent improvement she seems to be very close to complete resolution. Electronic Signature(s) Signed: 12/29/2022 2:13:53 PM By: Allen Derry PA-C Entered By: Allen Derry on 12/29/2022 14:13:52 -------------------------------------------------------------------------------- Physical Exam Details Patient Name: Date of Service: Kristie, [name] 12/29/2022 1:30 PM Medical Record Number: [id number] Patient Account Number: [id number] Date of Birth/Sex: Treating RN: Nov 25, 1940 (82 y.o. Freddy Finner Primary Care Provider: Darnelle Spangle Other Clinician: Referring Provider: Treating Provider/Extender: Paulina Fusi Weeks in Treatment: 7 Constitutional Well-nourished and well-hydrated in no acute distress. Respiratory normal breathing without difficulty. Psychiatric this patient is able to make decisions and demonstrates good insight into disease process. Alert and Oriented x 3. pleasant and cooperative. Notes Patient's wound currently showed signs of good granulation and epithelization at this point. Fortunately I do not see any evidence of active infection locally nor systemically which is great news and in general I do believe that we are making good progress towards. complete healing Electronic Signature(s) Signed: 12/29/2022 2:15:04 PM By: Allen Derry PA-C Entered By: Allen Derry on 12/29/2022 14:15:03 -------------------------------------------------------------------------------- Physician Orders Details Patient Name: Date of Service: [name], [name] 12/29/2022 1:30 PM [name] ([id number]) [id number] Page 3 of 7 Medical  Record Number: [id number] Patient Account Number: [id number] Date of Birth/Sex: Treating RN: May 14, 1941 (82 y.o. Freddy Finner Primary Care Provider: Darnelle Spangle Other Clinician: Referring Provider: Treating Provider/Extender: Paulina Fusi Weeks in Treatment: 7 Verbal / Phone Orders: No Diagnosis Coding ICD-10 Coding Code Description E11.622 Type 2 diabetes mellitus with other skin ulcer I87.331 Chronic venous hypertension (idiopathic) with ulcer and inflammation of right lower extremity L97.812 Non-pressure chronic ulcer of other part of right lower leg with fat layer exposed I10 Essential (primary) hypertension Follow-up Appointments Return Appointment in 1 week. Bathing/ Shower/ Hygiene May shower with wound dressing protected with water repellent cover or cast protector. No tub bath. Anesthetic (Use 'Patient Medications' Section for Anesthetic Order Entry) Lidocaine applied to wound bed Edema Control - Lymphedema / Segmental Compressive Device / Other Elevate, Exercise Daily and A void Standing for Long Periods of Time. Elevate legs to the level of the heart and pump ankles as often as possible Elevate leg(s) parallel to the floor when sitting. Wound Treatment Wound #1 - Lower Leg Wound Laterality: Right, Medial Cleanser: Byram Ancillary Kit - 15 Day Supply (Generic) 3 x Per Week/30 Days Discharge Instructions: Use supplies as instructed; Kit contains: (15) Saline Bullets; (15) 3x3 Gauze; 15 pr Gloves Cleanser: Soap and Water 3 x Per Week/30 Days Discharge Instructions: Gently cleanse wound with antibacterial soap, rinse and pat dry prior to dressing wounds Cleanser: Wound Cleanser (Generic) 3 x Per Week/30 Days Discharge Instructions: Wash your hands with soap and water. Remove old dressing, discard into plastic bag and place into trash. Cleanse the wound with Wound Cleanser prior to applying a clean dressing using gauze sponges, not tissues or cotton balls. Do  not scrub or use excessive force. Pat dry using gauze sponges, not tissue or cotton balls. Prim Dressing: Prisma 4.34 (in) 3 x Per Week/30 Days ary Discharge Instructions: Moisten w/normal saline or sterile water; Cover wound as directed. Do  not remove from wound bed. Secondary Dressing: (BORDER) Zetuvit Plus SILICONE BORDER Dressing 5x5 (in/in) (Generic) 3 x Per Week/30 Days Discharge Instructions: Please do not put silicone bordered dressings under wraps. Use non-bordered dressing only. Secured With: Tubigrip Size D, 3x10 (in/yd) 3 x Per Week/30 Days Discharge Instructions: double layer Electronic Signature(s) Signed: 12/29/2022 3:15:47 PM By: Yevonne Pax RN Signed: 12/29/2022 5:13:07 PM By: Allen Derry PA-C Entered By: Yevonne Pax on 12/29/2022 13:54:10 Problem List Details -------------------------------------------------------------------------------- [name] ([id number]) [id number] Page 4 of 7 Patient Name: Date of Service: [name], [name] 12/29/2022 1:30 PM Medical Record Number: [id number] Patient Account Number: [id number] Date of Birth/Sex: Treating RN: Dec 07, 1940 (82 y.o. Freddy Finner Primary Care Provider: Darnelle Spangle Other Clinician: Referring Provider: Treating Provider/Extender: Paulina Fusi Weeks in Treatment: 7 Active Problems ICD-10 Encounter Code Description Active Date MDM Diagnosis E11.622 Type 2 diabetes mellitus with other skin ulcer 11/09/2022 No Yes I87.331 Chronic venous hypertension (idiopathic) with ulcer and inflammation of right 11/09/2022 No Yes lower extremity L97.812 Non-pressure chronic ulcer of other part of right lower leg with fat layer 11/09/2022 No Yes exposed I10 Essential (primary) hypertension 11/09/2022 No Yes Inactive Problems Resolved Problems Electronic Signature(s) Signed: 12/29/2022 1:50:16 PM By: Allen Derry PA-C Entered By: Allen Derry on 12/29/2022  13:50:16 -------------------------------------------------------------------------------- Progress Note Details Patient Name: Date of Service: [name], [name] 12/29/2022 1:30 PM Medical Record Number: [id number] Patient Account Number: [id number] Date of Birth/Sex: Treating RN: 1940/10/11 (82 y.o. Freddy Finner Primary Care Provider: Darnelle Spangle Other Clinician: Referring Provider: Treating Provider/Extender: Paulina Fusi Weeks in Treatment: 7 Subjective Chief Complaint Information obtained from Patient Right LE Ulcer History of Present Illness (HPI) 11-09-2022 upon evaluation today patient appears to be doing somewhat poorly in regard to her wound on her right medial lower extremity. This occurred as a result of the trauma initially which I think turned into more of an abscess. This does have some tracking underneath she has 3 openings all which are part of the same wound. I am unsure if this is going end up opening up into the entire region or not but time will tell as far as that is concerned. With that being said right now she has been on doxycycline earlier in April she is out of the mupirocin at this point. With that being said I think she may need to be back on the doxycycline in order to ensure especially after I cleaned this up today that we do not worsen anything from an infection standpoint. [name], [name] ([id number]) [id number] Page 5 of 7 Patient does have a history of chronic venous hypertension, hypertension, and diabetes mellitus type 2. Her most recent hemoglobin A1c was 5.9 on October 20, 2022 11-16-2022 upon evaluation today patient appears to be doing better in regard to her wound which is actually slowly started to look improved as far as the overall appearance of the wound bed is concerned. Fortunately I do not see any signs of active infection locally or systemically which is great news and overall I am extremely  pleased with where things stand currently. 11-24-2022 upon evaluation today patient appears to be doing well currently in regard to her leg ulcer. She has been making some progress here. Some of the skin is thinned out between the openings and I think we probably need to open this up some of these more confluent area versus the spider openings in multiple areas. 11-30-2022 upon evaluation today patient appears to be doing well  currently in regard to her wound she has been tolerating the dressing changes without complication. With that being said I do think that we are making pretty good progress here which is great news. I do not see any evidence of active infection locally nor systemically which is excellent as well. 12-07-2022 upon evaluation today patient actually appears to be making some great improvements here in regard to her wound. In fact this is showing signs of good granulation and there is really no need for sharp debridement today which is great news as well. 12-14-2022 upon evaluation today patient appears to be doing well currently in regard to her wounds she is actually showing some signs of improvement and I am very pleased in that regard. The undermining areas and tunneling is actually dramatically improved compared to what it was. 12-22-2022 upon evaluation today patient appears to be doing well currently in regard to her wounds which are actually showing signs of significant improvement. I am actually very pleased with where we stand I think she is making great progress. 12-29-2022 upon evaluation today patient appears to be doing well currently in regard to her wound in fact this is showing signs of excellent improvement she seems to be very close to complete resolution. Objective Constitutional Well-nourished and well-hydrated in no acute distress. Vitals Time Taken: 1:38 PM, Height: 64 in, Weight: 190 lbs, BMI: 32.6, Temperature: 98.1 F, Pulse: 61 bpm, Respiratory Rate: 18  breaths/min, Blood Pressure: 130/69 mmHg. Respiratory normal breathing without difficulty. Psychiatric this patient is able to make decisions and demonstrates good insight into disease process. Alert and Oriented x 3. pleasant and cooperative. General Notes: Patient's wound currently showed signs of good granulation and epithelization at this point. Fortunately I do not see any evidence of active infection locally nor systemically which is great news and in general I do believe that we are making good progress towards. complete healing Integumentary (Hair, Skin) Wound #1 status is Open. Original cause of wound was Trauma. The date acquired was: 10/12/2022. The wound has been in treatment 7 weeks. The wound is located on the Right,Medial Lower Leg. The wound measures 0.7cm length x 0.6cm width x 0.1cm depth; 0.33cm^2 area and 0.033cm^3 volume. There is Fat Layer (Subcutaneous Tissue) exposed. There is no tunneling or undermining noted. There is a medium amount of serosanguineous drainage noted. There is large (67-100%) granulation within the wound bed. There is no necrotic tissue within the wound bed. Assessment Active Problems ICD-10 Type 2 diabetes mellitus with other skin ulcer Chronic venous hypertension (idiopathic) with ulcer and inflammation of right lower extremity Non-pressure chronic ulcer of other part of right lower leg with fat layer exposed Essential (primary) hypertension Plan Follow-up Appointments: Return Appointment in 1 week. Bathing/ Shower/ Hygiene: May shower with wound dressing protected with water repellent cover or cast protector. No tub bath. Anesthetic (Use 'Patient Medications' Section for Anesthetic Order Entry): Lidocaine applied to wound bed Edema Control - Lymphedema / Segmental Compressive Device / OtherJEVON, [name] ([id number]) [id number] Page 6 of 7 Elevate, Exercise Daily and Avoid Standing for Long Periods of  Time. Elevate legs to the level of the heart and pump ankles as often as possible Elevate leg(s) parallel to the floor when sitting. WOUND #1: - Lower Leg Wound Laterality: Right, Medial Cleanser: Byram Ancillary Kit - 15 Day Supply (Generic) 3 x Per Week/30 Days Discharge Instructions: Use supplies as instructed; Kit contains: (15) Saline Bullets; (15) 3x3 Gauze; 15 pr Gloves Cleanser: Soap and Water 3  x Per Week/30 Days Discharge Instructions: Gently cleanse wound with antibacterial soap, rinse and pat dry prior to dressing wounds Cleanser: Wound Cleanser (Generic) 3 x Per Week/30 Days Discharge Instructions: Wash your hands with soap and water. Remove old dressing, discard into plastic bag and place into trash. Cleanse the wound with Wound Cleanser prior to applying a clean dressing using gauze sponges, not tissues or cotton balls. Do not scrub or use excessive force. Pat dry using gauze sponges, not tissue or cotton balls. Prim Dressing: Prisma 4.34 (in) 3 x Per Week/30 Days ary Discharge Instructions: Moisten w/normal saline or sterile water; Cover wound as directed. Do not remove from wound bed. Secondary Dressing: (BORDER) Zetuvit Plus SILICONE BORDER Dressing 5x5 (in/in) (Generic) 3 x Per Week/30 Days Discharge Instructions: Please do not put silicone bordered dressings under wraps. Use non-bordered dressing only. Secured With: Tubigrip Size D, 3x10 (in/yd) 3 x Per Week/30 Days Discharge Instructions: double layer 1. Based on what I am seeing I do believe that the patient is making good headway towards closure. I am very pleased in that regard and I am good recommend at this time that we have the patient continue to utilize the silver collagen which I think is really doing a good job. 2. I am also can recommend that the patient continue to monitor for any signs of infection or worsening. 3. I am also can recommend that she should continue to use the Tubigrip I think this is doing a good  job. We will see patient back for reevaluation in 1 week here in the clinic. If anything worsens or changes patient will contact our office for additional recommendations. Electronic Signature(s) Signed: 12/29/2022 2:15:44 PM By: Allen Derry PA-C Entered By: Allen Derry on 12/29/2022 14:15:44 -------------------------------------------------------------------------------- SuperBill Details Patient Name: Date of Service: [name], [name] 12/29/2022 Medical Record Number: [id number] Patient Account Number: [id number] Date of Birth/Sex: Treating RN: [birth year] (82 y.o. Freddy Finner Primary Care Provider: Darnelle Spangle Other Clinician: Referring Provider: Treating Provider/Extender: Paulina Fusi Weeks in Treatment: 7 Diagnosis Coding ICD-10 Codes Code Description E11.622 Type 2 diabetes mellitus with other skin ulcer I87.331 Chronic venous hypertension (idiopathic) with ulcer and inflammation of right lower extremity L97.812 Non-pressure chronic ulcer of other part of right lower leg with fat layer exposed I10 Essential (primary) hypertension Facility Procedures : CPT4 Code: [id number] Description: 47829 - WOUND CARE VISIT-LEV 2 EST PT Modifier: Quantity: 1 Physician Procedures : CPT4 Code Description Modifier [id number] - WC PHYS LEVEL 3 - EST PT ICD-10 Diagnosis Description E11.622 Type 2 diabetes mellitus with other skin ulcer I87.331 Chronic venous hypertension (idiopathic) with ulcer and inflammation of right lower  extremity [name], [name] ([id number]) [id number] Pa L97.812 Non-pressure chronic ulcer of other part of right lower leg with fat layer exposed I10 Essential (primary) hypertension Quantity: 1 ge 7 of 7 Electronic Signature(s) Signed: 12/29/2022 2:16:10 PM By: Allen Derry PA-C Entered By: Allen Derry on 12/29/2022 14:16:10

## 2022-12-29 NOTE — Progress Notes (Signed)
Kristie, [name] ([id number]) [id number] Page 1 of 9 Visit Report for 12/29/2022 Arrival Information Details Patient Name: Date of Service: Kristie, [name] 12/29/2022 1:30 PM Medical Record Number: [id number] Patient Account Number: [id number] Date of Birth/Sex: Treating RN: [birth year] (83 y.o. Freddy Finner Primary Care Paighton Godette: Darnelle Spangle Other Clinician: Referring Orin Eberwein: Treating Camira Geidel/Extender: Paulina Fusi Weeks in Treatment: 7 Visit Information History Since Last Visit Added or deleted any medications: No Patient Arrived: Ambulatory Any new allergies or adverse reactions: No Arrival Time: 13:33 Had a fall or experienced change in No Accompanied By: husband activities of daily living that may affect Transfer Assistance: None risk of falls: Patient Identification Verified: Yes Signs or symptoms of abuse/neglect since last visito No Secondary Verification Process Completed: Yes Hospitalized since last visit: No Patient Requires Transmission-Based Precautions: No Implantable device outside of the clinic excluding No Patient Has Alerts: No cellular tissue based products placed in the center since last visit: Has Dressing in Place as Prescribed: Yes Has Compression in Place as Prescribed: Yes Pain Present Now: No Electronic Signature(s) Signed: 12/29/2022 3:15:47 PM By: Yevonne Pax RN Entered By: Yevonne Pax on 12/29/2022 13:38:06 -------------------------------------------------------------------------------- Clinic Level of Care Assessment Details Patient Name: Date of ServiceBERYLE, [name] 12/29/2022 1:30 PM Medical Record Number: [id number] Patient Account Number: [id number] Date of Birth/Sex: Treating RN: 12-14-1940 (82 y.o. Freddy Finner Primary Care Chloe Flis: Darnelle Spangle Other Clinician: Referring Gizell Danser: Treating Lachlan Mckim/Extender: Paulina Fusi Weeks in Treatment: 7 Clinic Level of  Care Assessment Items TOOL 4 Quantity Score X- 1 0 Use when only an EandM is performed on FOLLOW-UP visit ASSESSMENTS - Nursing Assessment / Reassessment X- 1 10 Reassessment of Co-morbidities (includes updates in patient status) X- 1 5 Reassessment of Adherence to Treatment Plan Kristie, [name] ([id number]) [id number] Page 2 of 9 ASSESSMENTS - Wound and Skin A ssessment / Reassessment X - Simple Wound Assessment / Reassessment - one wound 1 5 []  - 0 Complex Wound Assessment / Reassessment - multiple wounds []  - 0 Dermatologic / Skin Assessment (not related to wound area) ASSESSMENTS - Focused Assessment []  - 0 Circumferential Edema Measurements - multi extremities []  - 0 Nutritional Assessment / Counseling / Intervention []  - 0 Lower Extremity Assessment (monofilament, tuning fork, pulses) []  - 0 Peripheral Arterial Disease Assessment (using hand held doppler) ASSESSMENTS - Ostomy and/or Continence Assessment and Care []  - 0 Incontinence Assessment and Management []  - 0 Ostomy Care Assessment and Management (repouching, etc.) PROCESS - Coordination of Care X - Simple Patient / Family Education for ongoing care 1 15 []  - 0 Complex (extensive) Patient / Family Education for ongoing care []  - 0 Staff obtains Chiropractor, Records, T Results / Process Orders est []  - 0 Staff telephones HHA, Nursing Homes / Clarify orders / etc []  - 0 Routine Transfer to another Facility (non-emergent condition) []  - 0 Routine Hospital Admission (non-emergent condition) []  - 0 New Admissions / Manufacturing engineer / Ordering NPWT Apligraf, etc. , []  - 0 Emergency Hospital Admission (emergent condition) X- 1 10 Simple Discharge Coordination []  - 0 Complex (extensive) Discharge Coordination PROCESS - Special Needs []  - 0 Pediatric / Minor Patient Management []  - 0 Isolation Patient Management []  - 0 Hearing / Language / Visual special needs []  -  0 Assessment of Community assistance (transportation, D/C planning, etc.) []  - 0 Additional assistance / Altered mentation []  - 0 Support Surface(s) Assessment (bed, cushion, seat, etc.) INTERVENTIONS - Wound Cleansing / Measurement X - Simple  Wound Cleansing - one wound 1 5 []  - 0 Complex Wound Cleansing - multiple wounds X- 1 5 Wound Imaging (photographs - any number of wounds) []  - 0 Wound Tracing (instead of photographs) X- 1 5 Simple Wound Measurement - one wound []  - 0 Complex Wound Measurement - multiple wounds INTERVENTIONS - Wound Dressings X - Small Wound Dressing one or multiple wounds 1 10 []  - 0 Medium Wound Dressing one or multiple wounds []  - 0 Large Wound Dressing one or multiple wounds []  - 0 Application of Medications - topical []  - 0 Application of Medications - injection INTERVENTIONS - Miscellaneous []  - 0 External ear exam Kristie, Montierth [name] ([id number]) [id number] Page 3 of 9 []  - 0 Specimen Collection (cultures, biopsies, blood, body fluids, etc.) []  - 0 Specimen(s) / Culture(s) sent or taken to Lab for analysis []  - 0 Patient Transfer (multiple staff / Michiel Sites Lift / Similar devices) []  - 0 Simple Staple / Suture removal (25 or less) []  - 0 Complex Staple / Suture removal (26 or more) []  - 0 Hypo / Hyperglycemic Management (close monitor of Blood Glucose) []  - 0 Ankle / Brachial Index (ABI) - do not check if billed separately X- 1 5 Vital Signs Has the patient been seen at the hospital within the last three years: Yes Total Score: 75 Level Of Care: New/Established - Level 2 Electronic Signature(s) Signed: 12/29/2022 3:15:47 PM By: Yevonne Pax RN Entered By: Yevonne Pax on 12/29/2022 13:54:48 -------------------------------------------------------------------------------- Encounter Discharge Information Details Patient Name: Date of Service: [name], [name] 12/29/2022 1:30 PM Medical Record Number:  [id number] Patient Account Number: [id number] Date of Birth/Sex: Treating RN: 19-Aug-1940 (82 y.o. Freddy Finner Primary Care Prescott Truex: Darnelle Spangle Other Clinician: Referring Moira Umholtz: Treating Tor Tsuda/Extender: Paulina Fusi Weeks in Treatment: 7 Encounter Discharge Information Items Discharge Condition: Stable Ambulatory Status: Ambulatory Discharge Destination: Home Transportation: Private Auto Accompanied By: husband Schedule Follow-up Appointment: Yes Clinical Summary of Care: Electronic Signature(s) Signed: 12/29/2022 3:15:47 PM By: Yevonne Pax RN Entered By: Yevonne Pax on 12/29/2022 13:56:02 Lower Extremity Assessment Details -------------------------------------------------------------------------------- [name] ([id number]) [id number] Page 4 of 9 Patient Name: Date of Service: Kristie, [name] 12/29/2022 1:30 PM Medical Record Number: [id number] Patient Account Number: [id number] Date of Birth/Sex: Treating RN: 12-27-1940 (82 y.o. Freddy Finner Primary Care Chaka Boyson: Darnelle Spangle Other Clinician: Referring Kerrington Greenhalgh: Treating Nickia Boesen/Extender: Paulina Fusi Weeks in Treatment: 7 Edema Assessment Left: Right: Assessed: No No Edema: Yes Calf Left: Right: Point of Measurement: 36 cm From Medial Instep 37 cm Ankle Left: Right: Point of Measurement: 11 cm From Medial Instep 24 cm Vascular Assessment Left: Right: Pulses: Dorsalis Pedis Palpable: Yes Electronic Signature(s) Signed: 12/29/2022 3:15:47 PM By: Yevonne Pax RN Entered By: Yevonne Pax on 12/29/2022 13:42:28 -------------------------------------------------------------------------------- Multi Wound Chart Details Patient Name: Date of Service: [name], [name] 12/29/2022 1:30 PM Medical Record Number: [id number] Patient Account Number: [id number] Date of Birth/Sex: Treating RN: 1941-05-29 (82 y.o. Freddy Finner Primary Care  Jehiel Koepp: Darnelle Spangle Other Clinician: Referring Jull Harral: Treating Mahum Betten/Extender: Paulina Fusi Weeks in Treatment: 7 Vital Signs Height(in): 64 Pulse(bpm): 61 Weight(lbs): 190 Blood Pressure(mmHg): 130/69 Body Mass Index(BMI): 32.6 Temperature(F): 98.1 Respiratory Rate(breaths/min): 18 [1:Photos:] [N/A:N/A] Right, Medial Lower Leg N/A N/A Wound Location: Trauma N/A N/A Wounding Event: Kristie, [name] ([id number]) [id number] Page 5 of 9 Diabetic Wound/Ulcer of the Lower N/A N/A Primary Etiology: Extremity Lymphedema, Hypertension, Type II N/A N/A Comorbid History: Diabetes 10/12/2022 N/A N/A Date Acquired: 7 N/A N/A  Weeks of Treatment: Open N/A N/A Wound Status: No N/A N/A Wound Recurrence: 0.7x0.6x0.1 N/A N/A Measurements L x W x D (cm) 0.33 N/A N/A A (cm) : rea 0.033 N/A N/A Volume (cm) : 95.50% N/A N/A % Reduction in A rea: 99.60% N/A N/A % Reduction in Volume: Grade 1 N/A N/A Classification: Medium N/A N/A Exudate A mount: Serosanguineous N/A N/A Exudate Type: red, brown N/A N/A Exudate Color: Large (67-100%) N/A N/A Granulation A mount: None Present (0%) N/A N/A Necrotic A mount: Fat Layer (Subcutaneous Tissue): Yes N/A N/A Exposed Structures: Fascia: No Tendon: No Muscle: No Joint: No Bone: No None N/A N/A Epithelialization: Treatment Notes Electronic Signature(s) Signed: 12/29/2022 3:15:47 PM By: Yevonne Pax RN Entered By: Yevonne Pax on 12/29/2022 13:42:37 -------------------------------------------------------------------------------- Multi-Disciplinary Care Plan Details Patient Name: Date of Service: [name], [name] 12/29/2022 1:30 PM Medical Record Number: [id number] Patient Account Number: [id number] Date of Birth/Sex: Treating RN: 08/26/1940 (82 y.o. Freddy Finner Primary Care Teja Judice: Darnelle Spangle Other Clinician: Referring Woodruff Skirvin: Treating Torrez Renfroe/Extender: Paulina Fusi Weeks in Treatment: 7 Active Inactive Wound/Skin Impairment Nursing Diagnoses: Knowledge deficit related to ulceration/compromised skin integrity Goals: Patient/caregiver will verbalize understanding of skin care regimen Date Initiated: 11/09/2022 Target Resolution Date: 01/09/2023 Goal Status: Active Ulcer/skin breakdown will have a volume reduction of 30% by week 4 Date Initiated: 11/09/2022 Date Inactivated: 12/14/2022 Target Resolution Date: 12/10/2022 Goal Status: Unmet Unmet Reason: comorbidities Ulcer/skin breakdown will have a volume reduction of 50% by week 8 Date Initiated: 11/09/2022 Target Resolution Date: 01/09/2023 Goal Status: Active Ulcer/skin breakdown will have a volume reduction of 80% by week 12 Date Initiated: 11/09/2022 Target Resolution Date: 02/09/2023 Goal Status: Active Kristie, [name] ([id number]) [id number] Page 6 of 9 Ulcer/skin breakdown will heal within 14 weeks Date Initiated: 11/09/2022 Target Resolution Date: 03/12/2023 Goal Status: Active Interventions: Assess patient/caregiver ability to obtain necessary supplies Assess patient/caregiver ability to perform ulcer/skin care regimen upon admission and as needed Assess ulceration(s) every visit Notes: Electronic Signature(s) Signed: 12/29/2022 3:15:47 PM By: Yevonne Pax RN Entered By: Yevonne Pax on 12/29/2022 13:55:11 -------------------------------------------------------------------------------- Pain Assessment Details Patient Name: Date of ServiceCATHERINE, [name] 12/29/2022 1:30 PM Medical Record Number: [id number] Patient Account Number: [id number] Date of Birth/Sex: Treating RN: [birth year] (82 y.o. Freddy Finner Primary Care Milda Lindvall: Darnelle Spangle Other Clinician: Referring Abhay Godbolt: Treating Angeli Demilio/Extender: Paulina Fusi Weeks in Treatment: 7 Active Problems Location of Pain Severity and Description of Pain Patient Has  Paino No Site Locations Pain Management and Medication Current Pain Management: Electronic Signature(s) Signed: 12/29/2022 3:15:47 PM By: Yevonne Pax RN Entered By: Yevonne Pax on 12/29/2022 13:38:45 [name] ([id number]) [id number] Page 7 of 9 -------------------------------------------------------------------------------- Patient/Caregiver Education Details Patient Name: Date of Service: Kristie, [name] 7/2/2024andnbsp1:30 PM Medical Record Number: [id number] Patient Account Number: [id number] Date of Birth/Gender: Treating RN: 10/21/1940 (82 y.o. Freddy Finner Primary Care Physician: Darnelle Spangle Other Clinician: Referring Physician: Treating Physician/Extender: Paulina Fusi Weeks in Treatment: 7 Education Assessment Education Provided To: Patient Education Topics Provided Wound/Skin Impairment: Handouts: Caring for Your Ulcer Methods: Explain/Verbal Responses: State content correctly Electronic Signature(s) Signed: 12/29/2022 3:15:47 PM By: Yevonne Pax RN Entered By: Yevonne Pax on 12/29/2022 13:43:19 -------------------------------------------------------------------------------- Wound Assessment Details Patient Name: Date of Service: Kristie, [name] 12/29/2022 1:30 PM Medical Record Number: [id number] Patient Account Number: [id number] Date of Birth/Sex: Treating RN: 11/05/1940 (82 y.o. Freddy Finner Primary Care Monalisa Bayless: Darnelle Spangle Other Clinician: Referring Anaja Monts: Treating Merial Moritz/Extender: Paulina Fusi Tania Ade  in Treatment: 7 Wound Status Wound Number: 1 Primary Etiology: Diabetic Wound/Ulcer of the Lower Extremity Wound Location: Right, Medial Lower Leg Wound Status: Open Wounding Event: Trauma Comorbid History: Lymphedema, Hypertension, Type II Diabetes Date Acquired: 10/12/2022 Weeks Of Treatment: 7 Clustered Wound: No Photos Kristie, [name] ([id number])  [id number] Page 8 of 9 Wound Measurements Length: (cm) 0.7 Width: (cm) 0.6 Depth: (cm) 0.1 Area: (cm) 0.33 Volume: (cm) 0.033 % Reduction in Area: 95.5% % Reduction in Volume: 99.6% Epithelialization: None Tunneling: No Undermining: No Wound Description Classification: Grade 1 Exudate Amount: Medium Exudate Type: Serosanguineous Exudate Color: red, brown Foul Odor After Cleansing: No Slough/Fibrino No Wound Bed Granulation Amount: Large (67-100%) Exposed Structure Necrotic Amount: None Present (0%) Fascia Exposed: No Fat Layer (Subcutaneous Tissue) Exposed: Yes Tendon Exposed: No Muscle Exposed: No Joint Exposed: No Bone Exposed: No Treatment Notes Wound #1 (Lower Leg) Wound Laterality: Right, Medial Cleanser [place] Ancillary Kit - 15 Day Supply Discharge Instruction: Use supplies as instructed; Kit contains: (15) Saline Bullets; (15) 3x3 Gauze; 15 pr Gloves Soap and Water Discharge Instruction: Gently cleanse wound with antibacterial soap, rinse and pat dry prior to dressing wounds Wound Cleanser Discharge Instruction: Wash your hands with soap and water. Remove old dressing, discard into plastic bag and place into trash. Cleanse the wound with Wound Cleanser prior to applying a clean dressing using gauze sponges, not tissues or cotton balls. Do not scrub or use excessive force. Pat dry using gauze sponges, not tissue or cotton balls. Peri-Wound Care Topical Primary Dressing Prisma 4.34 (in) Discharge Instruction: Moisten w/normal saline or sterile water; Cover wound as directed. Do not remove from wound bed. Secondary Dressing (BORDER) Zetuvit Plus SILICONE BORDER Dressing 5x5 (in/in) Discharge Instruction: Please do not put silicone bordered dressings under wraps. Use non-bordered dressing only. Secured With Tubigrip Size D, 3x10 (in/yd) Discharge Instruction: double layer Compression Wrap Compression Stockings Add-Ons Electronic  Signature(s) Signed: 12/29/2022 3:15:47 PM By: Yevonne Pax RN Entered By: Yevonne Pax on 12/29/2022 13:42:03 [name] ([id number]) [id number] Page 9 of 9 -------------------------------------------------------------------------------- Vitals Details Patient Name: Date of Service: RYNLEIGH, STAAL 12/29/2022 1:30 PM Medical Record Number: [id number] Patient Account Number: [id number] Date of Birth/Sex: Treating RN: [birth year] (82 y.o. Freddy Finner Primary Care Milledge Gerding: Darnelle Spangle Other Clinician: Referring Omega Slager: Treating Terre Zabriskie/Extender: Paulina Fusi Weeks in Treatment: 7 Vital Signs Time Taken: 13:38 Temperature (F): 98.1 Height (in): 64 Pulse (bpm): 61 Weight (lbs): 190 Respiratory Rate (breaths/min): 18 Body Mass Index (BMI): 32.6 Blood Pressure (mmHg): 130/69 Reference Range: 80 - 120 mg / dl Electronic Signature(s) Signed: 12/29/2022 3:15:47 PM By: Yevonne Pax RN Entered By: Yevonne Pax on 12/29/2022 13:38:38

## 2023-01-05 ENCOUNTER — Encounter: Payer: Medicare PPO | Admitting: Physician Assistant

## 2023-01-05 DIAGNOSIS — E11622 Type 2 diabetes mellitus with other skin ulcer: Secondary | ICD-10-CM | POA: Diagnosis not present

## 2023-01-05 NOTE — Progress Notes (Addendum)
[name], [name] ([id number]) [id number] Page 1 of 7 Visit Report for 01/05/2023 Chief Complaint Document Details Patient Name: Date of Service: [name], [name] 01/05/2023 2:45 PM Medical Record Number: [id number] Patient Account Number: [id number] Date of Birth/Sex: Treating RN: September 27, 1940 (82 y.o. [name] Primary Care Provider: Darnelle Spangle Other Clinician: Betha Loa Referring Provider: Treating Provider/Extender: Paulina Fusi Weeks in Treatment: 8 Information Obtained from: Patient Chief Complaint Right LE Ulcer Electronic Signature(s) Signed: 01/05/2023 2:50:49 PM By: Allen Derry PA-C Entered By: Allen Derry on 01/05/2023 14:50:49 -------------------------------------------------------------------------------- HPI Details Patient Name: Date of Service: [name], [name] 01/05/2023 2:45 PM Medical Record Number: [id number] Patient Account Number: [id number] Date of Birth/Sex: Treating RN: 1941/03/11 (82 y.o. [name] Primary Care Provider: Darnelle Spangle Other Clinician: Betha Loa Referring Provider: Treating Provider/Extender: Paulina Fusi Weeks in Treatment: 8 History of Present Illness HPI Description: 11-09-2022 upon evaluation today patient appears to be doing somewhat poorly in regard to her wound on her right medial lower extremity. This occurred as a result of the trauma initially which I think turned into more of an abscess. This does have some tracking underneath she has 3 openings all which are part of the same wound. I am unsure if this is going end up opening up into the entire region or not but time will tell as far as that is concerned. With that being said right now she has been on doxycycline earlier in April she is out of the mupirocin at this point. With that being said I think she may need to be back on the doxycycline in order to ensure especially after I cleaned this up today  that we do not worsen anything from an infection standpoint. Patient does have a history of chronic venous hypertension, hypertension, and diabetes mellitus type 2. Her most recent hemoglobin A1c was 5.9 on October 20, 2022 11-16-2022 upon evaluation today patient appears to be doing better in regard to her wound which is actually slowly started to look improved as far as the overall appearance of the wound bed is concerned. Fortunately I do not see any signs of active infection locally or systemically which is great news and overall I am extremely pleased with where things stand currently. 11-24-2022 upon evaluation today patient appears to be doing well currently in regard to her leg ulcer. She has been making some progress here. Some of the skin is thinned out between the openings and I think we probably need to open this up some of these more confluent area versus the spider openings in multiple areas. [name], [name] ([id number]) [id number] Page 2 of 7 11-30-2022 upon evaluation today patient appears to be doing well currently in regard to her wound she has been tolerating the dressing changes without complication. With that being said I do think that we are making pretty good progress here which is great news. I do not see any evidence of active infection locally nor systemically which is excellent as well. 12-07-2022 upon evaluation today patient actually appears to be making some great improvements here in regard to her wound. In fact this is showing signs of good granulation and there is really no need for sharp debridement today which is great news as well. 12-14-2022 upon evaluation today patient appears to be doing well currently in regard to her wounds she is actually showing some signs of improvement and I am very pleased in that regard. The undermining areas and tunneling is actually dramatically improved compared to  what it was. 12-22-2022 upon evaluation today  patient appears to be doing well currently in regard to her wounds which are actually showing signs of significant improvement. I am actually very pleased with where we stand I think she is making great progress. 12-29-2022 upon evaluation today patient appears to be doing well currently in regard to her wound in fact this is showing signs of excellent improvement she seems to be very close to complete resolution. 01-05-2023 upon evaluation today patient appears to be doing excellent in regard to her leg ulcer. She has been tolerating the dressing changes without complication and in general I do believe that we are making excellent headway towards complete closure. This wound is dramatically improved from where she started. Electronic Signature(s) Signed: 01/05/2023 6:17:46 PM By: Allen Derry PA-C Entered By: Allen Derry on 01/05/2023 18:17:45 -------------------------------------------------------------------------------- Physical Exam Details Patient Name: Date of Service: [name], [name] 01/05/2023 2:45 PM Medical Record Number: [id number] Patient Account Number: [id number] Date of Birth/Sex: Treating RN: 1941/04/09 (82 y.o. [name] Primary Care Provider: Darnelle Spangle Other Clinician: Betha Loa Referring Provider: Treating Provider/Extender: Paulina Fusi Weeks in Treatment: 8 Constitutional Well-nourished and well-hydrated in no acute distress. Respiratory normal breathing without difficulty. Psychiatric this patient is able to make decisions and demonstrates good insight into disease process. Alert and Oriented x 3. pleasant and cooperative. Notes Upon inspection patient's wound bed actually showed signs of good granulation epithelization at this point. The collagen is doing an excellent job my suggestion is good to be that we continue with collagen over the next week she is in agreement with that plan. Electronic Signature(s) Signed: 01/05/2023 6:18:00 PM By:  Allen Derry PA-C Entered By: Allen Derry on 01/05/2023 18:17:59 [name] ([id number]) [id number] Page 3 of 7 -------------------------------------------------------------------------------- Physician Orders Details Patient Name: Date of Service: [name], [name] 01/05/2023 2:45 PM Medical Record Number: [id number] Patient Account Number: [id number] Date of Birth/Sex: Treating RN: 1941-02-13 (82 y.o. [name] Primary Care Provider: Darnelle Spangle Other Clinician: Betha Loa Referring Provider: Treating Provider/Extender: Paulina Fusi Weeks in Treatment: 8 Verbal / Phone Orders: Yes Clinician: Midge Aver Read Back and Verified: Yes Diagnosis Coding ICD-10 Coding Code Description E11.622 Type 2 diabetes mellitus with other skin ulcer I87.331 Chronic venous hypertension (idiopathic) with ulcer and inflammation of right lower extremity L97.812 Non-pressure chronic ulcer of other part of right lower leg with fat layer exposed I10 Essential (primary) hypertension Follow-up Appointments Return Appointment in 1 week. Bathing/ Shower/ Hygiene May shower with wound dressing protected with water repellent cover or cast protector. No tub bath. Anesthetic (Use 'Patient Medications' Section for Anesthetic Order Entry) Lidocaine applied to wound bed Edema Control - Lymphedema / Segmental Compressive Device / Other Elevate, Exercise Daily and A void Standing for Long Periods of Time. Elevate legs to the level of the heart and pump ankles as often as possible Elevate leg(s) parallel to the floor when sitting. Wound Treatment Wound #1 - Lower Leg Wound Laterality: Right, Medial Cleanser: Byram Ancillary Kit - 15 Day Supply (Generic) 3 x Per Week/30 Days Discharge Instructions: Use supplies as instructed; Kit contains: (15) Saline Bullets; (15) 3x3 Gauze; 15 pr Gloves Cleanser: Soap and Water 3 x Per Week/30 Days Discharge  Instructions: Gently cleanse wound with antibacterial soap, rinse and pat dry prior to dressing wounds Cleanser: Wound Cleanser (Generic) 3 x Per Week/30 Days Discharge Instructions: Wash your hands with soap and water. Remove old dressing, discard into plastic bag and  place into trash. Cleanse the wound with Wound Cleanser prior to applying a clean dressing using gauze sponges, not tissues or cotton balls. Do not scrub or use excessive force. Pat dry using gauze sponges, not tissue or cotton balls. Prim Dressing: Promogran Matrix 4.34 (in) 3 x Per Week/30 Days ary Discharge Instructions: Moisten w/normal saline or sterile water; Cover wound as directed. Do not remove from wound bed. Secondary Dressing: (BORDER) Zetuvit Plus SILICONE BORDER Dressing 5x5 (in/in) (Generic) 3 x Per Week/30 Days Discharge Instructions: Please do not put silicone bordered dressings under wraps. Use non-bordered dressing only. Secured With: Tubigrip Size D, 3x10 (in/yd) 3 x Per Week/30 Days Discharge Instructions: double layer Electronic Signature(s) Signed: 01/05/2023 7:04:22 PM By: Allen Derry PA-C Signed: 01/11/2023 4:35:54 PM By: Betha Loa Entered By: Betha Loa on 01/05/2023 15:47:23 [name] ([id number]) [id number] Page 4 of 7 -------------------------------------------------------------------------------- Problem List Details Patient Name: Date of Service: [name], [name] 01/05/2023 2:45 PM Medical Record Number: [id number] Patient Account Number: [id number] Date of Birth/Sex: Treating RN: Feb 21, 1941 (82 y.o. [name] Primary Care Provider: Darnelle Spangle Other Clinician: Betha Loa Referring Provider: Treating Provider/Extender: Paulina Fusi Weeks in Treatment: 8 Active Problems ICD-10 Encounter Code Description Active Date MDM Diagnosis E11.622 Type 2 diabetes mellitus with other skin ulcer 11/09/2022 No Yes I87.331 Chronic  venous hypertension (idiopathic) with ulcer and inflammation of right 11/09/2022 No Yes lower extremity L97.812 Non-pressure chronic ulcer of other part of right lower leg with fat layer 11/09/2022 No Yes exposed I10 Essential (primary) hypertension 11/09/2022 No Yes Inactive Problems Resolved Problems Electronic Signature(s) Signed: 01/05/2023 2:50:42 PM By: Allen Derry PA-C Entered By: Allen Derry on 01/05/2023 14:50:42 -------------------------------------------------------------------------------- Progress Note Details Patient Name: Date of Service: [name], [name] 01/05/2023 2:45 PM Medical Record Number: [id number] Patient Account Number: [id number] Date of Birth/Sex: Treating RN: 07/14/1940 (82 y.o. [name] Primary Care Provider: Darnelle Spangle Other Clinician: Betha Loa Referring Provider: Treating Provider/Extender: Paulina Fusi Weeks in Treatment: 8 [place], [name] ([id number]) [id number] Page 5 of 7 Subjective Chief Complaint Information obtained from Patient Right LE Ulcer History of Present Illness (HPI) 11-09-2022 upon evaluation today patient appears to be doing somewhat poorly in regard to her wound on her right medial lower extremity. This occurred as a result of the trauma initially which I think turned into more of an abscess. This does have some tracking underneath she has 3 openings all which are part of the same wound. I am unsure if this is going end up opening up into the entire region or not but time will tell as far as that is concerned. With that being said right now she has been on doxycycline earlier in April she is out of the mupirocin at this point. With that being said I think she may need to be back on the doxycycline in order to ensure especially after I cleaned this up today that we do not worsen anything from an infection standpoint. Patient does have a history of chronic venous hypertension,  hypertension, and diabetes mellitus type 2. Her most recent hemoglobin A1c was 5.9 on October 20, 2022 11-16-2022 upon evaluation today patient appears to be doing better in regard to her wound which is actually slowly started to look improved as far as the overall appearance of the wound bed is concerned. Fortunately I do not see any signs of active infection locally or systemically which is great news and overall I am extremely pleased with where things stand  currently. 11-24-2022 upon evaluation today patient appears to be doing well currently in regard to her leg ulcer. She has been making some progress here. Some of the skin is thinned out between the openings and I think we probably need to open this up some of these more confluent area versus the spider openings in multiple areas. 11-30-2022 upon evaluation today patient appears to be doing well currently in regard to her wound she has been tolerating the dressing changes without complication. With that being said I do think that we are making pretty good progress here which is great news. I do not see any evidence of active infection locally nor systemically which is excellent as well. 12-07-2022 upon evaluation today patient actually appears to be making some great improvements here in regard to her wound. In fact this is showing signs of good granulation and there is really no need for sharp debridement today which is great news as well. 12-14-2022 upon evaluation today patient appears to be doing well currently in regard to her wounds she is actually showing some signs of improvement and I am very pleased in that regard. The undermining areas and tunneling is actually dramatically improved compared to what it was. 12-22-2022 upon evaluation today patient appears to be doing well currently in regard to her wounds which are actually showing signs of significant improvement. I am actually very pleased with where we stand I think she is making great  progress. 12-29-2022 upon evaluation today patient appears to be doing well currently in regard to her wound in fact this is showing signs of excellent improvement she seems to be very close to complete resolution. 01-05-2023 upon evaluation today patient appears to be doing excellent in regard to her leg ulcer. She has been tolerating the dressing changes without complication and in general I do believe that we are making excellent headway towards complete closure. This wound is dramatically improved from where she started. Objective Constitutional Well-nourished and well-hydrated in no acute distress. Vitals Time Taken: 2:55 PM, Height: 64 in, Weight: 190 lbs, BMI: 32.6, Temperature: 97.9 F, Pulse: 51 bpm, Respiratory Rate: 18 breaths/min, Blood Pressure: 125/67 mmHg. Respiratory normal breathing without difficulty. Psychiatric this patient is able to make decisions and demonstrates good insight into disease process. Alert and Oriented x 3. pleasant and cooperative. General Notes: Upon inspection patient's wound bed actually showed signs of good granulation epithelization at this point. The collagen is doing an excellent job my suggestion is good to be that we continue with collagen over the next week she is in agreement with that plan. Integumentary (Hair, Skin) Wound #1 status is Open. Original cause of wound was Trauma. The date acquired was: 10/12/2022. The wound has been in treatment 8 weeks. The wound is located on the Right,Medial Lower Leg. The wound measures 0.7cm length x 0.6cm width x 0.1cm depth; 0.33cm^2 area and 0.033cm^3 volume. There is Fat Layer (Subcutaneous Tissue) exposed. There is a medium amount of serosanguineous drainage noted. There is large (67-100%) granulation within the wound bed. There is no necrotic tissue within the wound bed. Assessment Active Problems ICD-10 Type 2 diabetes mellitus with other skin ulcer [name], [name] ([id number])  [id number] Page 6 of 7 Chronic venous hypertension (idiopathic) with ulcer and inflammation of right lower extremity Non-pressure chronic ulcer of other part of right lower leg with fat layer exposed Essential (primary) hypertension Plan Follow-up Appointments: Return Appointment in 1 week. Bathing/ Shower/ Hygiene: May shower with wound dressing protected with water repellent cover  or cast protector. No tub bath. Anesthetic (Use 'Patient Medications' Section for Anesthetic Order Entry): Lidocaine applied to wound bed Edema Control - Lymphedema / Segmental Compressive Device / Other: Elevate, Exercise Daily and Avoid Standing for Long Periods of Time. Elevate legs to the level of the heart and pump ankles as often as possible Elevate leg(s) parallel to the floor when sitting. WOUND #1: - Lower Leg Wound Laterality: Right, Medial Cleanser: Byram Ancillary Kit - 15 Day Supply (Generic) 3 x Per Week/30 Days Discharge Instructions: Use supplies as instructed; Kit contains: (15) Saline Bullets; (15) 3x3 Gauze; 15 pr Gloves Cleanser: Soap and Water 3 x Per Week/30 Days Discharge Instructions: Gently cleanse wound with antibacterial soap, rinse and pat dry prior to dressing wounds Cleanser: Wound Cleanser (Generic) 3 x Per Week/30 Days Discharge Instructions: Wash your hands with soap and water. Remove old dressing, discard into plastic bag and place into trash. Cleanse the wound with Wound Cleanser prior to applying a clean dressing using gauze sponges, not tissues or cotton balls. Do not scrub or use excessive force. Pat dry using gauze sponges, not tissue or cotton balls. Prim Dressing: Promogran Matrix 4.34 (in) 3 x Per Week/30 Days ary Discharge Instructions: Moisten w/normal saline or sterile water; Cover wound as directed. Do not remove from wound bed. Secondary Dressing: (BORDER) Zetuvit Plus SILICONE BORDER Dressing 5x5 (in/in) (Generic) 3 x Per Week/30  Days Discharge Instructions: Please do not put silicone bordered dressings under wraps. Use non-bordered dressing only. Secured With: Tubigrip Size D, 3x10 (in/yd) 3 x Per Week/30 Days Discharge Instructions: double layer 1. I would recommend that we have the patient continue to monitor for any evidence of infection or worsening. Based on what I am seeing I do believe that we are making good headway towards complete closure. 2. I am good recommend as well that the patient should continue with the Border foam dressing to cover along with the Tubigrip size D. 3. I am also going to recommend that she should continue to elevate her legs much as possible to help with edema control. We will see patient back for reevaluation in 1 week here in the clinic. If anything worsens or changes patient will contact our office for additional recommendations. Electronic Signature(s) Signed: 01/05/2023 6:19:03 PM By: Allen Derry PA-C Entered By: Allen Derry on 01/05/2023 18:19:03 -------------------------------------------------------------------------------- SuperBill Details Patient Name: Date of Service: [name], [name] 01/05/2023 Medical Record Number: [id number] Patient Account Number: [id number] Date of Birth/Sex: Treating RN: 1940-09-02 (82 y.o. [name] Primary Care Provider: Darnelle Spangle Other Clinician: Betha Loa Referring Provider: Treating Provider/Extender: Paulina Fusi Weeks in Treatment: 8 Diagnosis Coding ICD-10 Codes Code Description E11.622 Type 2 diabetes mellitus with other skin ulcer [name], [name] ([id number]) [id number] Page 7 of 7 I87.331 Chronic venous hypertension (idiopathic) with ulcer and inflammation of right lower extremity L97.812 Non-pressure chronic ulcer of other part of right lower leg with fat layer exposed I10 Essential (primary) hypertension Facility Procedures : CPT4 Code: [id number] Description: 99213 -  WOUND CARE VISIT-LEV 3 EST PT Modifier: Quantity: 1 Physician Procedures : CPT4 Code Description Modifier [id number] 99213 - WC PHYS LEVEL 3 - EST PT ICD-10 Diagnosis Description E11.622 Type 2 diabetes mellitus with other skin ulcer I87.331 Chronic venous hypertension (idiopathic) with ulcer and inflammation of right lower  extremity L97.812 Non-pressure chronic ulcer of other part of right lower leg with fat layer exposed I10 Essential (primary) hypertension Quantity: 1 Electronic Signature(s) Signed: 01/05/2023 6:27:35 PM By: Larina Bras,  Nataly Pacifico PA-C Entered By: Allen Derry on 01/05/2023 18:27:34

## 2023-01-05 NOTE — Progress Notes (Addendum)
MAYBELLE, DEPAOLI ([id number]) [id number] Page 1 of 9 Visit Report for 01/05/2023 Arrival Information Details Patient Name: Date of Service: [name], [name] 01/05/2023 2:45 PM Medical Record Number: [id number] Patient Account Number: [id number] Date of Birth/Sex: Treating RN: Oct 27, 1940 (82 y.o. [name] Primary Care Vincent Ehrler: Darnelle Spangle Other Clinician: Betha Loa Referring Kye Silverstein: Treating Jeani Fassnacht/Extender: Paulina Fusi Weeks in Treatment: 8 Visit Information History Since Last Visit All ordered tests and consults were completed: No Patient Arrived: Ambulatory Added or deleted any medications: No Arrival Time: 14:46 Any new allergies or adverse reactions: No Transfer Assistance: None Had a fall or experienced change in No Patient Identification Verified: Yes activities of daily living that may affect Secondary Verification Process Completed: Yes risk of falls: Patient Requires Transmission-Based Precautions: No Signs or symptoms of abuse/neglect since last visito No Patient Has Alerts: No Hospitalized since last visit: No Implantable device outside of the clinic excluding No cellular tissue based products placed in the center since last visit: Has Dressing in Place as Prescribed: Yes Has Compression in Place as Prescribed: Yes Pain Present Now: No Electronic Signature(s) Signed: 01/11/2023 4:35:54 PM By: Betha Loa Entered By: Betha Loa on 01/05/2023 14:53:16 -------------------------------------------------------------------------------- Clinic Level of Care Assessment Details Patient Name: Date of ServiceTESA, [name] 01/05/2023 2:45 PM Medical Record Number: [id number] Patient Account Number: [id number] Date of Birth/Sex: Treating RN: 04-Dec-1940 (82 y.o. [name] Primary Care Mylin Hirano: Darnelle Spangle Other Clinician: Betha Loa Referring Jazziel Fitzsimmons: Treating Rasheeda Mulvehill/Extender: Paulina Fusi Weeks in Treatment: 8 Clinic Level of Care Assessment Items TOOL 4 Quantity Score []  - 0 Use when only an EandM is performed on FOLLOW-UP visit ASSESSMENTS - Nursing Assessment / Reassessment X- 1 10 Reassessment of Co-morbidities (includes updates in patient status) [name], [name] ([id number]) [id number] Page 2 of 9 X- 1 5 Reassessment of Adherence to Treatment Plan ASSESSMENTS - Wound and Skin A ssessment / Reassessment X - Simple Wound Assessment / Reassessment - one wound 1 5 []  - 0 Complex Wound Assessment / Reassessment - multiple wounds []  - 0 Dermatologic / Skin Assessment (not related to wound area) ASSESSMENTS - Focused Assessment []  - 0 Circumferential Edema Measurements - multi extremities []  - 0 Nutritional Assessment / Counseling / Intervention []  - 0 Lower Extremity Assessment (monofilament, tuning fork, pulses) []  - 0 Peripheral Arterial Disease Assessment (using hand held doppler) ASSESSMENTS - Ostomy and/or Continence Assessment and Care []  - 0 Incontinence Assessment and Management []  - 0 Ostomy Care Assessment and Management (repouching, etc.) PROCESS - Coordination of Care X - Simple Patient / Family Education for ongoing care 1 15 []  - 0 Complex (extensive) Patient / Family Education for ongoing care []  - 0 Staff obtains Chiropractor, Records, T Results / Process Orders est []  - 0 Staff telephones HHA, Nursing Homes / Clarify orders / etc []  - 0 Routine Transfer to another Facility (non-emergent condition) []  - 0 Routine Hospital Admission (non-emergent condition) []  - 0 New Admissions / Manufacturing engineer / Ordering NPWT Apligraf, etc. , []  - 0 Emergency Hospital Admission (emergent condition) X- 1 10 Simple Discharge Coordination []  - 0 Complex (extensive) Discharge Coordination PROCESS - Special Needs []  - 0 Pediatric / Minor Patient Management []  - 0 Isolation Patient  Management []  - 0 Hearing / Language / Visual special needs []  - 0 Assessment of Community assistance (transportation, D/C planning, etc.) []  - 0 Additional assistance / Altered mentation []  - 0 Support Surface(s) Assessment (bed, cushion, seat, etc.) INTERVENTIONS -  Wound Cleansing / Measurement X - Simple Wound Cleansing - one wound 1 5 []  - 0 Complex Wound Cleansing - multiple wounds X- 1 5 Wound Imaging (photographs - any number of wounds) []  - 0 Wound Tracing (instead of photographs) X- 1 5 Simple Wound Measurement - one wound []  - 0 Complex Wound Measurement - multiple wounds INTERVENTIONS - Wound Dressings []  - 0 Small Wound Dressing one or multiple wounds X- 1 15 Medium Wound Dressing one or multiple wounds []  - 0 Large Wound Dressing one or multiple wounds []  - 0 Application of Medications - topical []  - 0 Application of Medications - injection INTERVENTIONS - Miscellaneous [name], [name] ([id number]) [id number] Page 3 of 9 []  - 0 External ear exam []  - 0 Specimen Collection (cultures, biopsies, blood, body fluids, etc.) []  - 0 Specimen(s) / Culture(s) sent or taken to Lab for analysis []  - 0 Patient Transfer (multiple staff / Michiel Sites Lift / Similar devices) []  - 0 Simple Staple / Suture removal (25 or less) []  - 0 Complex Staple / Suture removal (26 or more) []  - 0 Hypo / Hyperglycemic Management (close monitor of Blood Glucose) []  - 0 Ankle / Brachial Index (ABI) - do not check if billed separately X- 1 5 Vital Signs Has the patient been seen at the hospital within the last three years: Yes Total Score: 80 Level Of Care: New/Established - Level 3 Electronic Signature(s) Signed: 01/11/2023 4:35:54 PM By: Betha Loa Entered By: Betha Loa on 01/05/2023 15:48:07 -------------------------------------------------------------------------------- Encounter Discharge Information Details Patient Name: Date of Service: [name] 01/05/2023 2:45 PM Medical Record Number: [id number] Patient Account Number: [id number] Date of Birth/Sex: Treating RN: March 09, 1941 (82 y.o. [name] Primary Care Latiana Tomei: Darnelle Spangle Other Clinician: Betha Loa Referring Joanthony Hamza: Treating Angeleah Labrake/Extender: Paulina Fusi Weeks in Treatment: 8 Encounter Discharge Information Items Discharge Condition: Stable Ambulatory Status: Ambulatory Discharge Destination: Home Transportation: Private Auto Accompanied By: husband Schedule Follow-up Appointment: Yes Clinical Summary of Care: Electronic Signature(s) Signed: 01/11/2023 4:35:54 PM By: Betha Loa Entered By: Betha Loa on 01/05/2023 16:46:21 [name] ([id number]) [id number] Page 4 of 9 -------------------------------------------------------------------------------- Lower Extremity Assessment Details Patient Name: Date of ServiceKIYLEE, [name] 01/05/2023 2:45 PM Medical Record Number: [id number] Patient Account Number: [id number] Date of Birth/Sex: Treating RN: [birth year] (82 y.o. [name] Primary Care Fronnie Urton: Darnelle Spangle Other Clinician: Betha Loa Referring Ali Mclaurin: Treating Kenadie Royce/Extender: Paulina Fusi Weeks in Treatment: 8 Edema Assessment Assessed: [Left: No] [Right: Yes] Edema: [Left: Ye] [Right: s] Calf Left: Right: Point of Measurement: 36 cm From Medial Instep 37 cm Ankle Left: Right: Point of Measurement: 11 cm From Medial Instep 23.6 cm Vascular Assessment Pulses: Dorsalis Pedis Palpable: [Right:Yes] Electronic Signature(s) Signed: 01/07/2023 5:16:05 PM By: Midge Aver MSN RN CNS WTA Signed: 01/11/2023 4:35:54 PM By: Betha Loa Entered By: Betha Loa on 01/05/2023 15:03:53 -------------------------------------------------------------------------------- Multi Wound Chart Details Patient Name: Date of Service: [name],  [name] 01/05/2023 2:45 PM Medical Record Number: [id number] Patient Account Number: [id number] Date of Birth/Sex: Treating RN: September 07, 1940 (82 y.o. [name] Primary Care Merrie Epler: Darnelle Spangle Other Clinician: Betha Loa Referring Delcia Spitzley: Treating Teana Lindahl/Extender: Paulina Fusi Weeks in Treatment: 8 Vital Signs Height(in): 64 Pulse(bpm): 51 Weight(lbs): 190 Blood Pressure(mmHg): 125/67 Body Mass Index(BMI): 32.6 Temperature(F): 97.9 Respiratory Rate(breaths/min): 18 [1:Photos:] [N/A:N/A] Right, Medial Lower Leg N/A N/A Wound Location: Trauma N/A N/A Wounding Event: Diabetic Wound/Ulcer of the Lower N/A N/A Primary Etiology: Extremity Lymphedema, Hypertension, Type II  N/A N/A Comorbid History: Diabetes 10/12/2022 N/A N/A Date Acquired: 8 N/A N/A Weeks of Treatment: Open N/A N/A Wound Status: No N/A N/A Wound Recurrence: 0.7x0.6x0.1 N/A N/A Measurements L x W x D (cm) 0.33 N/A N/A A (cm) : rea 0.033 N/A N/A Volume (cm) : 95.50% N/A N/A % Reduction in A rea: 99.60% N/A N/A % Reduction in Volume: Grade 1 N/A N/A Classification: Medium N/A N/A Exudate A mount: Serosanguineous N/A N/A Exudate Type: red, brown N/A N/A Exudate Color: Large (67-100%) N/A N/A Granulation A mount: None Present (0%) N/A N/A Necrotic A mount: Fat Layer (Subcutaneous Tissue): Yes N/A N/A Exposed Structures: Fascia: No Tendon: No Muscle: No Joint: No Bone: No None N/A N/A Epithelialization: Treatment Notes Electronic Signature(s) Signed: 01/11/2023 4:35:54 PM By: Betha Loa Entered By: Betha Loa on 01/05/2023 15:04:04 -------------------------------------------------------------------------------- Multi-Disciplinary Care Plan Details Patient Name: Date of Service: [name], [name] 01/05/2023 2:45 PM Medical Record Number: [id number] Patient Account Number: [id number] Date of Birth/Sex: Treating RN: [birth year] (82 y.o. [name] Primary Care Arvel Oquinn: Darnelle Spangle Other Clinician: Betha Loa Referring Dallis Czaja: Treating Kymberley Raz/Extender: Paulina Fusi Weeks in Treatment: 8 Active Inactive Wound/Skin Impairment Nursing Diagnoses: Knowledge deficit related to ulceration/compromised skin integrity Goals: Patient/caregiver will verbalize understanding of skin care regimen Date Initiated: 11/09/2022 Target Resolution Date: 01/09/2023 Goal Status: Active Ulcer/skin breakdown will have a volume reduction of 30% by week 4 Date Initiated: 11/09/2022 Date Inactivated: 12/14/2022 Target Resolution Date: 12/10/2022 Goal Status: Unmet Unmet Reason: comorbidities Ulcer/skin breakdown will have a volume reduction of 50% by week 8 Date Initiated: 11/09/2022 Target Resolution Date: 01/09/2023 IREM, STONEHAM ([id number]) [id number] Page 6 of 9 Goal Status: Active Ulcer/skin breakdown will have a volume reduction of 80% by week 12 Date Initiated: 11/09/2022 Target Resolution Date: 02/09/2023 Goal Status: Active Ulcer/skin breakdown will heal within 14 weeks Date Initiated: 11/09/2022 Target Resolution Date: 03/12/2023 Goal Status: Active Interventions: Assess patient/caregiver ability to obtain necessary supplies Assess patient/caregiver ability to perform ulcer/skin care regimen upon admission and as needed Assess ulceration(s) every visit Notes: Electronic Signature(s) Signed: 01/07/2023 5:16:05 PM By: Midge Aver MSN RN CNS WTA Signed: 01/11/2023 4:35:54 PM By: Betha Loa Entered By: Betha Loa on 01/05/2023 16:45:29 -------------------------------------------------------------------------------- Pain Assessment Details Patient Name: Date of Service: [name], [name] 01/05/2023 2:45 PM Medical Record Number: [id number] Patient Account Number: [id number] Date of Birth/Sex: Treating RN: 1941/05/04 (82 y.o. [name] Primary Care Humbert Morozov: Darnelle Spangle Other Clinician: Betha Loa Referring Kristi Norment: Treating Brandii Lakey/Extender: Paulina Fusi Weeks in Treatment: 8 Active Problems Location of Pain Severity and Description of Pain Patient Has Paino No Site Locations Pain Management and Medication Current Pain Management: Electronic Signature(s) Signed: 01/07/2023 5:16:05 PM By: Midge Aver MSN RN CNS WTA Signed: 01/11/2023 4:35:54 PM By: Betha Loa Entered By: Betha Loa on 01/05/2023 15:00:14 [name] ([id number]) [id number] Page 7 of 9 -------------------------------------------------------------------------------- Patient/Caregiver Education Details Patient Name: Date of Service: [name], [name] 7/9/2024andnbsp2:45 PM Medical Record Number: [id number] Patient Account Number: [id number] Date of Birth/Gender: Treating RN: [birth year] (82 y.o. [name] Primary Care Physician: Darnelle Spangle Other Clinician: Betha Loa Referring Physician: Treating Physician/Extender: Paulina Fusi Weeks in Treatment: 8 Education Assessment Education Provided To: Patient Education Topics Provided Wound/Skin Impairment: Handouts: Other: continue wound care as directed Methods: Explain/Verbal Responses: State content correctly Electronic Signature(s) Signed: 01/11/2023 4:35:54 PM By: Betha Loa Entered By: Betha Loa on 01/05/2023 16:45:46 -------------------------------------------------------------------------------- Wound Assessment Details Patient Name: Date of Service: [name],  [place] 01/05/2023 2:45 PM Medical Record Number: [id number] Patient Account Number: [id number] Date of Birth/Sex: Treating RN: March 28, 1941 (82 y.o. [name] Primary Care Graviel Payeur: Darnelle Spangle Other Clinician: Betha Loa Referring Lenola Lockner: Treating Caytlyn Evers/Extender: Paulina Fusi Weeks in Treatment: 8 Wound Status Wound Number:  1 Primary Etiology: Diabetic Wound/Ulcer of the Lower Extremity Wound Location: Right, Medial Lower Leg Wound Status: Open Wounding Event: Trauma Comorbid History: Lymphedema, Hypertension, Type II Diabetes Date Acquired: 10/12/2022 Weeks Of Treatment: 8 Clustered Wound: No Photos [name], [name] ([id number]) [id number] Page 8 of 9 Wound Measurements Length: (cm) 0.7 Width: (cm) 0.6 Depth: (cm) 0.1 Area: (cm) 0.33 Volume: (cm) 0.033 % Reduction in Area: 95.5% % Reduction in Volume: 99.6% Epithelialization: None Wound Description Classification: Grade 1 Exudate Amount: Medium Exudate Type: Serosanguineous Exudate Color: red, brown Foul Odor After Cleansing: No Slough/Fibrino No Wound Bed Granulation Amount: Large (67-100%) Exposed Structure Necrotic Amount: None Present (0%) Fascia Exposed: No Fat Layer (Subcutaneous Tissue) Exposed: Yes Tendon Exposed: No Muscle Exposed: No Joint Exposed: No Bone Exposed: No Treatment Notes Wound #1 (Lower Leg) Wound Laterality: Right, Medial Cleanser [place]ram Ancillary Kit - 15 Day Supply Discharge Instruction: Use supplies as instructed; Kit contains: (15) Saline Bullets; (15) 3x3 Gauze; 15 pr Gloves Soap and Water Discharge Instruction: Gently cleanse wound with antibacterial soap, rinse and pat dry prior to dressing wounds Wound Cleanser Discharge Instruction: Wash your hands with soap and water. Remove old dressing, discard into plastic bag and place into trash. Cleanse the wound with Wound Cleanser prior to applying a clean dressing using gauze sponges, not tissues or cotton balls. Do not scrub or use excessive force. Pat dry using gauze sponges, not tissue or cotton balls. Peri-Wound Care Topical Primary Dressing Promogran Matrix 4.34 (in) Discharge Instruction: Moisten w/normal saline or sterile water; Cover wound as directed. Do not remove from wound bed. Secondary Dressing (BORDER) Zetuvit Plus  SILICONE BORDER Dressing 5x5 (in/in) Discharge Instruction: Please do not put silicone bordered dressings under wraps. Use non-bordered dressing only. Secured With Tubigrip Size D, 3x10 (in/yd) Discharge Instruction: double layer Compression Wrap Compression Stockings Add-Ons Electronic Signature(s) Signed: 01/07/2023 5:16:05 PM By: Midge Aver MSN RN CNS WTA Signed: 01/11/2023 4:35:54 PM By: Lesle Reek, Analisia ([id number]) [id number] Page 9 of 9 Entered By: Betha Loa on 01/05/2023 15:01:59 -------------------------------------------------------------------------------- Vitals Details Patient Name: Date of Service: [name], [name] 01/05/2023 2:45 PM Medical Record Number: [id number] Patient Account Number: [id number] Date of Birth/Sex: Treating RN: 1941-03-12 (82 y.o. [name] Primary Care Tenecia Ignasiak: Darnelle Spangle Other Clinician: Betha Loa Referring Lakevia Perris: Treating Delmus Warwick/Extender: Paulina Fusi Weeks in Treatment: 8 Vital Signs Time Taken: 14:55 Temperature (F): 97.9 Height (in): 64 Pulse (bpm): 51 Weight (lbs): 190 Respiratory Rate (breaths/min): 18 Body Mass Index (BMI): 32.6 Blood Pressure (mmHg): 125/67 Reference Range: 80 - 120 mg / dl Electronic Signature(s) Signed: 01/11/2023 4:35:54 PM By: Betha Loa Entered By: Betha Loa on 01/05/2023 14:56:32

## 2023-01-14 ENCOUNTER — Encounter: Payer: Medicare PPO | Admitting: Physician Assistant

## 2023-01-14 DIAGNOSIS — E11622 Type 2 diabetes mellitus with other skin ulcer: Secondary | ICD-10-CM | POA: Diagnosis not present

## 2023-01-14 NOTE — Progress Notes (Addendum)
[name], [name] ([id number]) [id number] Page 1 of 7 Visit Report for 01/14/2023 Chief Complaint Document Details Patient Name: Date of Service: [name], [name] 01/14/2023 8:45 A M Medical Record Number: [id number] Patient Account Number: [id number] Date of Birth/Sex: Treating RN: 06-Jul-1940 (82 y.o. Freddy Finner Primary Care Provider: Darnelle Spangle Other Clinician: Referring Provider: Treating Provider/Extender: Paulina Fusi Weeks in Treatment: 9 Information Obtained from: Patient Chief Complaint Right LE Ulcer Electronic Signature(s) Signed: 01/14/2023 9:00:31 AM By: Allen Derry PA-C Entered By: Allen Derry on 01/14/2023 09:00:31 -------------------------------------------------------------------------------- HPI Details Patient Name: Date of Service: [name], [name] 01/14/2023 8:45 A M Medical Record Number: [id number] Patient Account Number: [id number] Date of Birth/Sex: Treating RN: 07/04/1940 (82 y.o. Freddy Finner Primary Care Provider: Darnelle Spangle Other Clinician: Referring Provider: Treating Provider/Extender: Paulina Fusi Weeks in Treatment: 9 History of Present Illness HPI Description: 11-09-2022 upon evaluation today patient appears to be doing somewhat poorly in regard to her wound on her right medial lower extremity. This occurred as a result of the trauma initially which I think turned into more of an abscess. This does have some tracking underneath she has 3 openings all which are part of the same wound. I am unsure if this is going end up opening up into the entire region or not but time will tell as far as that is concerned. With that being said right now she has been on doxycycline earlier in April she is out of the mupirocin at this point. With that being said I think she may need to be back on the doxycycline in order to ensure especially after I cleaned this up today that we do not worsen  anything from an infection standpoint. Patient does have a history of chronic venous hypertension, hypertension, and diabetes mellitus type 2. Her most recent hemoglobin A1c was 5.9 on October 20, 2022 11-16-2022 upon evaluation today patient appears to be doing better in regard to her wound which is actually slowly started to look improved as far as the overall appearance of the wound bed is concerned. Fortunately I do not see any signs of active infection locally or systemically which is great news and overall I am extremely pleased with where things stand currently. 11-24-2022 upon evaluation today patient appears to be doing well currently in regard to her leg ulcer. She has been making some progress here. Some of the skin is thinned out between the openings and I think we probably need to open this up some of these more confluent area versus the spider openings in multiple areas. [name], [name] ([id number]) [id number] Page 2 of 7 11-30-2022 upon evaluation today patient appears to be doing well currently in regard to her wound she has been tolerating the dressing changes without complication. With that being said I do think that we are making pretty good progress here which is great news. I do not see any evidence of active infection locally nor systemically which is excellent as well. 12-07-2022 upon evaluation today patient actually appears to be making some great improvements here in regard to her wound. In fact this is showing signs of good granulation and there is really no need for sharp debridement today which is great news as well. 12-14-2022 upon evaluation today patient appears to be doing well currently in regard to her wounds she is actually showing some signs of improvement and I am very pleased in that regard. The undermining areas and tunneling is actually dramatically improved compared to what it  was. 12-22-2022 upon evaluation today patient appears to be  doing well currently in regard to her wounds which are actually showing signs of significant improvement. I am actually very pleased with where we stand I think she is making great progress. 12-29-2022 upon evaluation today patient appears to be doing well currently in regard to her wound in fact this is showing signs of excellent improvement she seems to be very close to complete resolution. 01-05-2023 upon evaluation today patient appears to be doing excellent in regard to her leg ulcer. She has been tolerating the dressing changes without complication and in general I do believe that we are making excellent headway towards complete closure. This wound is dramatically improved from where she started. 01-14-2023 upon evaluation today patient appears to be doing well currently in regard to her wound. She has been tolerating dressing changes without complication. Fortunately there does not appear to be any signs of active infection locally or systemically at this time which is great news. No fevers, chills, nausea, vomiting, or diarrhea. Electronic Signature(s) Signed: 01/14/2023 9:04:09 AM By: Allen Derry PA-C Entered By: Allen Derry on 01/14/2023 09:04:09 -------------------------------------------------------------------------------- Physical Exam Details Patient Name: Date of Service: [name], [name] 01/14/2023 8:45 A M Medical Record Number: [id number] Patient Account Number: [id number] Date of Birth/Sex: Treating RN: [birth year] (82 y.o. Freddy Finner Primary Care Provider: Darnelle Spangle Other Clinician: Referring Provider: Treating Provider/Extender: Paulina Fusi Weeks in Treatment: 9 Constitutional Well-nourished and well-hydrated in no acute distress. Respiratory normal breathing without difficulty. Psychiatric this patient is able to make decisions and demonstrates good insight into disease process. Alert and Oriented x 3. pleasant and cooperative. Notes Upon  inspection patient's wound bed showed evidence of good granulation epithelization at this point. Fortunately there does not appear to be any evidence of infection at this point and in general I think that she is really doing quite well in fact I think she is very close to complete resolution. Electronic Signature(s) Signed: 01/14/2023 9:04:27 AM By: Allen Derry PA-C Entered By: Allen Derry on 01/14/2023 09:04:27 [name] ([id number]) [id number] Page 3 of 7 -------------------------------------------------------------------------------- Physician Orders Details Patient Name: Date of Service: [name], [name] 01/14/2023 8:45 A M Medical Record Number: [id number] Patient Account Number: [id number] Date of Birth/Sex: Treating RN: 29-Jul-1940 (82 y.o. Freddy Finner Primary Care Provider: Darnelle Spangle Other Clinician: Referring Provider: Treating Provider/Extender: Paulina Fusi Weeks in Treatment: 9 Verbal / Phone Orders: No Diagnosis Coding ICD-10 Coding Code Description E11.622 Type 2 diabetes mellitus with other skin ulcer I87.331 Chronic venous hypertension (idiopathic) with ulcer and inflammation of right lower extremity L97.812 Non-pressure chronic ulcer of other part of right lower leg with fat layer exposed I10 Essential (primary) hypertension Follow-up Appointments Return Appointment in 1 week. Bathing/ Shower/ Hygiene May shower with wound dressing protected with water repellent cover or cast protector. No tub bath. Anesthetic (Use 'Patient Medications' Section for Anesthetic Order Entry) Lidocaine applied to wound bed Edema Control - Lymphedema / Segmental Compressive Device / Other Elevate, Exercise Daily and A void Standing for Long Periods of Time. Elevate legs to the level of the heart and pump ankles as often as possible Elevate leg(s) parallel to the floor when sitting. Wound Treatment Wound #1 - Lower Leg Wound  Laterality: Right, Medial Cleanser: [employer]ram Ancillary Kit - 15 Day Supply (Generic) 3 x Per Week/30 Days Discharge Instructions: Use supplies as instructed; Kit contains: (15) Saline Bullets; (15) 3x3 Gauze; 15 pr Gloves Cleanser: Soap and  Water 3 x Per Week/30 Days Discharge Instructions: Gently cleanse wound with antibacterial soap, rinse and pat dry prior to dressing wounds Cleanser: Wound Cleanser (Generic) 3 x Per Week/30 Days Discharge Instructions: Wash your hands with soap and water. Remove old dressing, discard into plastic bag and place into trash. Cleanse the wound with Wound Cleanser prior to applying a clean dressing using gauze sponges, not tissues or cotton balls. Do not scrub or use excessive force. Pat dry using gauze sponges, not tissue or cotton balls. Prim Dressing: Promogran Matrix 4.34 (in) 3 x Per Week/30 Days ary Discharge Instructions: Moisten w/normal saline or sterile water; Cover wound as directed. Do not remove from wound bed. Secondary Dressing: (BORDER) Zetuvit Plus SILICONE BORDER Dressing 5x5 (in/in) (Generic) 3 x Per Week/30 Days Discharge Instructions: Please do not put silicone bordered dressings under wraps. Use non-bordered dressing only. Secured With: Tubigrip Size D, 3x10 (in/yd) 3 x Per Week/30 Days Discharge Instructions: double layer Electronic Signature(s) Signed: 01/14/2023 9:28:55 AM By: Yevonne Pax RN Signed: 01/25/2023 3:00:23 PM By: Allen Derry PA-C Entered By: Yevonne Pax on 01/14/2023 09:28:55 [name] ([id number]) [id number] Page 4 of 7 -------------------------------------------------------------------------------- Problem List Details Patient Name: Date of Service: [name], [name] 01/14/2023 8:45 A M Medical Record Number: [id number] Patient Account Number: [id number] Date of Birth/Sex: Treating RN: Feb 17, 1941 (82 y.o. Freddy Finner Primary Care Provider: Darnelle Spangle Other  Clinician: Referring Provider: Treating Provider/Extender: Paulina Fusi Weeks in Treatment: 9 Active Problems ICD-10 Encounter Code Description Active Date MDM Diagnosis E11.622 Type 2 diabetes mellitus with other skin ulcer 11/09/2022 No Yes I87.331 Chronic venous hypertension (idiopathic) with ulcer and inflammation of right 11/09/2022 No Yes lower extremity L97.812 Non-pressure chronic ulcer of other part of right lower leg with fat layer 11/09/2022 No Yes exposed I10 Essential (primary) hypertension 11/09/2022 No Yes Inactive Problems Resolved Problems Electronic Signature(s) Signed: 01/14/2023 9:00:24 AM By: Allen Derry PA-C Entered By: Allen Derry on 01/14/2023 09:00:23 -------------------------------------------------------------------------------- Progress Note Details Patient Name: Date of Service: [name], [name] 01/14/2023 8:45 A M Medical Record Number: [id number] Patient Account Number: [id number] Date of Birth/Sex: Treating RN: 01-31-1941 (82 y.o. Freddy Finner Primary Care Provider: Darnelle Spangle Other Clinician: Referring Provider: Treating Provider/Extender: Paulina Fusi [name], [name] ([id number]) [id number] Page 5 of 7 Weeks in Treatment: 9 Subjective Chief Complaint Information obtained from Patient Right LE Ulcer History of Present Illness (HPI) 11-09-2022 upon evaluation today patient appears to be doing somewhat poorly in regard to her wound on her right medial lower extremity. This occurred as a result of the trauma initially which I think turned into more of an abscess. This does have some tracking underneath she has 3 openings all which are part of the same wound. I am unsure if this is going end up opening up into the entire region or not but time will tell as far as that is concerned. With that being said right now she has been on doxycycline earlier in April she is out of the mupirocin at  this point. With that being said I think she may need to be back on the doxycycline in order to ensure especially after I cleaned this up today that we do not worsen anything from an infection standpoint. Patient does have a history of chronic venous hypertension, hypertension, and diabetes mellitus type 2. Her most recent hemoglobin A1c was 5.9 on October 20, 2022 11-16-2022 upon evaluation today patient appears to be doing better in regard to her wound  which is actually slowly started to look improved as far as the overall appearance of the wound bed is concerned. Fortunately I do not see any signs of active infection locally or systemically which is great news and overall I am extremely pleased with where things stand currently. 11-24-2022 upon evaluation today patient appears to be doing well currently in regard to her leg ulcer. She has been making some progress here. Some of the skin is thinned out between the openings and I think we probably need to open this up some of these more confluent area versus the spider openings in multiple areas. 11-30-2022 upon evaluation today patient appears to be doing well currently in regard to her wound she has been tolerating the dressing changes without complication. With that being said I do think that we are making pretty good progress here which is great news. I do not see any evidence of active infection locally nor systemically which is excellent as well. 12-07-2022 upon evaluation today patient actually appears to be making some great improvements here in regard to her wound. In fact this is showing signs of good granulation and there is really no need for sharp debridement today which is great news as well. 12-14-2022 upon evaluation today patient appears to be doing well currently in regard to her wounds she is actually showing some signs of improvement and I am very pleased in that regard. The undermining areas and tunneling is actually dramatically  improved compared to what it was. 12-22-2022 upon evaluation today patient appears to be doing well currently in regard to her wounds which are actually showing signs of significant improvement. I am actually very pleased with where we stand I think she is making great progress. 12-29-2022 upon evaluation today patient appears to be doing well currently in regard to her wound in fact this is showing signs of excellent improvement she seems to be very close to complete resolution. 01-05-2023 upon evaluation today patient appears to be doing excellent in regard to her leg ulcer. She has been tolerating the dressing changes without complication and in general I do believe that we are making excellent headway towards complete closure. This wound is dramatically improved from where she started. 01-14-2023 upon evaluation today patient appears to be doing well currently in regard to her wound. She has been tolerating dressing changes without complication. Fortunately there does not appear to be any signs of active infection locally or systemically at this time which is great news. No fevers, chills, nausea, vomiting, or diarrhea. Objective Constitutional Well-nourished and well-hydrated in no acute distress. Vitals Time Taken: 8:55 AM, Height: 64 in, Weight: 190 lbs, BMI: 32.6, Temperature: 98 F, Pulse: 48 bpm, Respiratory Rate: 18 breaths/min, Blood Pressure: 153/78 mmHg. Respiratory normal breathing without difficulty. Psychiatric this patient is able to make decisions and demonstrates good insight into disease process. Alert and Oriented x 3. pleasant and cooperative. General Notes: Upon inspection patient's wound bed showed evidence of good granulation epithelization at this point. Fortunately there does not appear to be any evidence of infection at this point and in general I think that she is really doing quite well in fact I think she is very close to complete resolution. Integumentary (Hair,  Skin) Wound #1 status is Open. Original cause of wound was Trauma. The date acquired was: 10/12/2022. The wound has been in treatment 9 weeks. The wound is located on the Right,Medial Lower Leg. The wound measures 0.4cm length x 0.4cm width x 0.1cm depth; 0.126cm^2 area  and 0.013cm^3 volume. There is Fat Layer (Subcutaneous Tissue) exposed. There is no tunneling or undermining noted. There is a medium amount of serosanguineous drainage noted. There is large (67-100%) granulation within the wound bed. There is no necrotic tissue within the wound bed. Assessment [name], [name] ([id number]) [id number] Page 6 of 7 Active Problems ICD-10 Type 2 diabetes mellitus with other skin ulcer Chronic venous hypertension (idiopathic) with ulcer and inflammation of right lower extremity Non-pressure chronic ulcer of other part of right lower leg with fat layer exposed Essential (primary) hypertension Plan 1. And I would recommend that we have the patient continue to monitor for any evidence of infection or worsening. Based on what I am seeing I do believe that we are making good headway towards complete closure. 2. I would recommend as well the patient should continue to use the collagen which I think is really doing a great job here. We will see patient back for reevaluation in 1 week here in the clinic. If anything worsens or changes patient will contact our office for additional recommendations. Electronic Signature(s) Signed: 01/14/2023 9:05:02 AM By: Allen Derry PA-C Entered By: Allen Derry on 01/14/2023 09:05:02 -------------------------------------------------------------------------------- SuperBill Details Patient Name: Date of Service: [name], [name] 01/14/2023 Medical Record Number: [id number] Patient Account Number: [id number] Date of Birth/Sex: Treating RN: 1940/10/12 (82 y.o. Freddy Finner Primary Care Provider: Darnelle Spangle Other Clinician: Referring  Provider: Treating Provider/Extender: Paulina Fusi Weeks in Treatment: 9 Diagnosis Coding ICD-10 Codes Code Description E11.622 Type 2 diabetes mellitus with other skin ulcer I87.331 Chronic venous hypertension (idiopathic) with ulcer and inflammation of right lower extremity L97.812 Non-pressure chronic ulcer of other part of right lower leg with fat layer exposed I10 Essential (primary) hypertension Facility Procedures : CPT4 Code: [id number] Description: [id number] - WOUND CARE VISIT-LEV 2 EST PT Modifier: Quantity: 1 Physician Procedures : CPT4 Code Description Modifier [id number] - WC PHYS LEVEL 3 - EST PT ICD-10 Diagnosis Description E11.622 Type 2 diabetes mellitus with other skin ulcer I87.331 Chronic venous hypertension (idiopathic) with ulcer and inflammation of right lower  extremity L97.812 Non-pressure chronic ulcer of other part of right lower leg with fat layer exposed I10 Essential (primary) hypertension MACHORRO, Gavriela ([id number]) [id number] Page 7 of 7 Quantity: 1 Electronic Signature(s) Signed: 01/14/2023 9:30:17 AM By: Yevonne Pax RN Signed: 01/25/2023 3:00:23 PM By: Allen Derry PA-C Previous Signature: 01/14/2023 9:06:27 AM Version By: Allen Derry PA-C Entered By: Yevonne Pax on 01/14/2023 09:30:17

## 2023-01-14 NOTE — Progress Notes (Signed)
SHANNEL, ZAHM ([id number]) [id number] Page 1 of 9 Visit Report for 01/14/2023 Arrival Information Details Patient Name: Date of Service: SHAWNTRICE, SALLE 01/14/2023 8:45 A M Medical Record Number: [id number] Patient Account Number: [id number] Date of Birth/Sex: Treating RN: 11/08/1940 (82 y.o. Freddy Finner Primary Care Moses Odoherty: Darnelle Spangle Other Clinician: Referring Imer Foxworth: Treating Ekam Besson/Extender: Paulina Fusi Weeks in Treatment: 9 Visit Information History Since Last Visit Added or deleted any medications: No Patient Arrived: Ambulatory Any new allergies or adverse reactions: No Arrival Time: 08:55 Had a fall or experienced change in No Transfer Assistance: None activities of daily living that may affect Patient Identification Verified: Yes risk of falls: Secondary Verification Process Completed: Yes Signs or symptoms of abuse/neglect since last visito No Patient Requires Transmission-Based Precautions: No Hospitalized since last visit: No Patient Has Alerts: No Implantable device outside of the clinic excluding No cellular tissue based products placed in the center since last visit: Has Dressing in Place as Prescribed: Yes Has Compression in Place as Prescribed: Yes Pain Present Now: No Electronic Signature(s) Signed: 01/14/2023 3:42:23 PM By: Yevonne Pax RN Entered By: Yevonne Pax on 01/14/2023 08:56:15 -------------------------------------------------------------------------------- Clinic Level of Care Assessment Details Patient Name: Date of Service: DONNIE, GEDEON 01/14/2023 8:45 A M Medical Record Number: [id number] Patient Account Number: [id number] Date of Birth/Sex: Treating RN: [birth year] (82 y.o. Freddy Finner Primary Care Zeena Starkel: Darnelle Spangle Other Clinician: Referring Caliana Spires: Treating Cindi Ghazarian/Extender: Paulina Fusi Weeks in Treatment: 9 Clinic Level of Care Assessment  Items TOOL 4 Quantity Score X- 1 0 Use when only an EandM is performed on FOLLOW-UP visit ASSESSMENTS - Nursing Assessment / Reassessment X- 1 10 Reassessment of Co-morbidities (includes updates in patient status) X- 1 5 Reassessment of Adherence to Treatment Plan EARLENE, BJELLAND ([id number]) [id number] Page 2 of 9 ASSESSMENTS - Wound and Skin A ssessment / Reassessment X - Simple Wound Assessment / Reassessment - one wound 1 5 []  - 0 Complex Wound Assessment / Reassessment - multiple wounds []  - 0 Dermatologic / Skin Assessment (not related to wound area) ASSESSMENTS - Focused Assessment []  - 0 Circumferential Edema Measurements - multi extremities []  - 0 Nutritional Assessment / Counseling / Intervention []  - 0 Lower Extremity Assessment (monofilament, tuning fork, pulses) []  - 0 Peripheral Arterial Disease Assessment (using hand held doppler) ASSESSMENTS - Ostomy and/or Continence Assessment and Care []  - 0 Incontinence Assessment and Management []  - 0 Ostomy Care Assessment and Management (repouching, etc.) PROCESS - Coordination of Care X - Simple Patient / Family Education for ongoing care 1 15 []  - 0 Complex (extensive) Patient / Family Education for ongoing care []  - 0 Staff obtains Chiropractor, Records, T Results / Process Orders est []  - 0 Staff telephones HHA, Nursing Homes / Clarify orders / etc []  - 0 Routine Transfer to another Facility (non-emergent condition) []  - 0 Routine Hospital Admission (non-emergent condition) []  - 0 New Admissions / Manufacturing engineer / Ordering NPWT Apligraf, etc. , []  - 0 Emergency Hospital Admission (emergent condition) X- 1 10 Simple Discharge Coordination []  - 0 Complex (extensive) Discharge Coordination PROCESS - Special Needs []  - 0 Pediatric / Minor Patient Management []  - 0 Isolation Patient Management []  - 0 Hearing / Language / Visual special needs []  - 0 Assessment of  Community assistance (transportation, D/C planning, etc.) []  - 0 Additional assistance / Altered mentation []  - 0 Support Surface(s) Assessment (bed, cushion, seat, etc.) INTERVENTIONS - Wound Cleansing / Measurement X - Simple Wound  Cleansing - one wound 1 5 []  - 0 Complex Wound Cleansing - multiple wounds X- 1 5 Wound Imaging (photographs - any number of wounds) []  - 0 Wound Tracing (instead of photographs) X- 1 5 Simple Wound Measurement - one wound []  - 0 Complex Wound Measurement - multiple wounds INTERVENTIONS - Wound Dressings X - Small Wound Dressing one or multiple wounds 1 10 []  - 0 Medium Wound Dressing one or multiple wounds []  - 0 Large Wound Dressing one or multiple wounds []  - 0 Application of Medications - topical []  - 0 Application of Medications - injection INTERVENTIONS - Miscellaneous []  - 0 External ear exam Aunika, Kirsten Shallen ([id number]) [id number].pdf Page 3 of 9 []  - 0 Specimen Collection (cultures, biopsies, blood, body fluids, etc.) []  - 0 Specimen(s) / Culture(s) sent or taken to Lab for analysis []  - 0 Patient Transfer (multiple staff / Michiel Sites Lift / Similar devices) []  - 0 Simple Staple / Suture removal (25 or less) []  - 0 Complex Staple / Suture removal (26 or more) []  - 0 Hypo / Hyperglycemic Management (close monitor of Blood Glucose) []  - 0 Ankle / Brachial Index (ABI) - do not check if billed separately X- 1 5 Vital Signs Has the patient been seen at the hospital within the last three years: Yes Total Score: 75 Level Of Care: New/Established - Level 2 Electronic Signature(s) Signed: 01/14/2023 3:42:23 PM By: Yevonne Pax RN Entered By: Yevonne Pax on 01/14/2023 09:29:59 -------------------------------------------------------------------------------- Encounter Discharge Information Details Patient Name: Date of Service: [name], Lurlene 01/14/2023 8:45 A M Medical Record Number: [id number] Patient  Account Number: [id number] Date of Birth/Sex: Treating RN: [birth year] (82 y.o. Freddy Finner Primary Care Rozanna Cormany: Darnelle Spangle Other Clinician: Referring Saige Busby: Treating Mikia Delaluz/Extender: Paulina Fusi Weeks in Treatment: 9 Encounter Discharge Information Items Discharge Condition: Stable Ambulatory Status: Ambulatory Discharge Destination: Home Transportation: Private Auto Accompanied By: husband Schedule Follow-up Appointment: Yes Clinical Summary of Care: Electronic Signature(s) Signed: 01/14/2023 9:31:02 AM By: Yevonne Pax RN Entered By: Yevonne Pax on 01/14/2023 09:31:02 Lower Extremity Assessment Details -------------------------------------------------------------------------------- Peggyann Juba ([id number]) [phone].pdf Page 4 of 9 Patient Name: Date of Service: BLUMA, BURESH 01/14/2023 8:45 A M Medical Record Number: [id number] Patient Account Number: [id number] Date of Birth/Sex: Treating RN: 03-Nov-1940 (82 y.o. Freddy Finner Primary Care Decklin Weddington: Darnelle Spangle Other Clinician: Referring Monnie Anspach: Treating Hugh Kamara/Extender: Paulina Fusi Weeks in Treatment: 9 Edema Assessment Left: Right: Assessed: No No Edema: No Calf Left: Right: Point of Measurement: 36 cm From Medial Instep 36 cm Ankle Left: Right: Point of Measurement: 11 cm From Medial Instep 23 cm Vascular Assessment Left: Right: Pulses: Dorsalis Pedis Palpable: Yes Electronic Signature(s) Signed: 01/14/2023 3:42:23 PM By: Yevonne Pax RN Entered By: Yevonne Pax on 01/14/2023 08:59:10 -------------------------------------------------------------------------------- Multi Wound Chart Details Patient Name: Date of Service: [name], Florencia 01/14/2023 8:45 A M Medical Record Number: [id number] Patient Account Number: [id number] Date of Birth/Sex: Treating RN: 03-14-1941 (82 y.o. Freddy Finner Primary Care Bralyn Folkert: Darnelle Spangle Other Clinician: Referring Makarios Madlock: Treating Rodney Wigger/Extender: Paulina Fusi Weeks in Treatment: 9 Vital Signs Height(in): 64 Pulse(bpm): 48 Weight(lbs): 190 Blood Pressure(mmHg): 153/78 Body Mass Index(BMI): 32.6 Temperature(F): 98 Respiratory Rate(breaths/min): 18 [1:Photos:] [N/A:N/A] Right, Medial Lower Leg N/A N/A Wound Location: Trauma N/A N/A Wounding Event: JESSYCA, SLOAN ([id number]) [phone].pdf Page 5 of 9 Diabetic Wound/Ulcer of the Lower N/A N/A Primary Etiology: Extremity Lymphedema, Hypertension, Type II N/A N/A Comorbid History: Diabetes 10/12/2022 N/A N/A Date Acquired: 9  N/A N/A Weeks of Treatment: Open N/A N/A Wound Status: No N/A N/A Wound Recurrence: 0.4x0.4x0.1 N/A N/A Measurements L x W x D (cm) 0.126 N/A N/A A (cm) : rea 0.013 N/A N/A Volume (cm) : 98.30% N/A N/A % Reduction in A rea: 99.80% N/A N/A % Reduction in Volume: Grade 1 N/A N/A Classification: Medium N/A N/A Exudate A mount: Serosanguineous N/A N/A Exudate Type: red, brown N/A N/A Exudate Color: Large (67-100%) N/A N/A Granulation A mount: None Present (0%) N/A N/A Necrotic A mount: Fat Layer (Subcutaneous Tissue): Yes N/A N/A Exposed Structures: Fascia: No Tendon: No Muscle: No Joint: No Bone: No None N/A N/A Epithelialization: Treatment Notes Electronic Signature(s) Signed: 01/14/2023 3:42:23 PM By: Yevonne Pax RN Entered By: Yevonne Pax on 01/14/2023 08:59:16 -------------------------------------------------------------------------------- Multi-Disciplinary Care Plan Details Patient Name: Date of Service: [name], [place] 01/14/2023 8:45 A M Medical Record Number: [id number] Patient Account Number: [id number] Date of Birth/Sex: Treating RN: 05-Jun-1941 (82 y.o. Freddy Finner Primary Care Kamora Vossler: Darnelle Spangle Other Clinician: Referring Paysley Poplar: Treating Nichlos Kunzler/Extender: Paulina Fusi Weeks in Treatment: 9 Active Inactive Wound/Skin Impairment Nursing Diagnoses: Knowledge deficit related to ulceration/compromised skin integrity Goals: Patient/caregiver will verbalize understanding of skin care regimen Date Initiated: 11/09/2022 Target Resolution Date: 02/09/2023 Goal Status: Active Ulcer/skin breakdown will have a volume reduction of 30% by week 4 Date Initiated: 11/09/2022 Date Inactivated: 12/14/2022 Target Resolution Date: 12/10/2022 Goal Status: Unmet Unmet Reason: comorbidities Ulcer/skin breakdown will have a volume reduction of 50% by week 8 Date Initiated: 11/09/2022 Date Inactivated: 01/14/2023 Target Resolution Date: 01/09/2023 Goal Status: Met Ulcer/skin breakdown will have a volume reduction of 80% by week 12 Date Initiated: 11/09/2022 Target Resolution Date: 02/09/2023 Goal Status: Active ARVELLA, [place] ([id number]) [phone].pdf Page 6 of 9 Ulcer/skin breakdown will heal within 14 weeks Date Initiated: 11/09/2022 Target Resolution Date: 03/12/2023 Goal Status: Active Interventions: Assess patient/caregiver ability to obtain necessary supplies Assess patient/caregiver ability to perform ulcer/skin care regimen upon admission and as needed Assess ulceration(s) every visit Notes: Electronic Signature(s) Signed: 01/14/2023 3:42:23 PM By: Yevonne Pax RN Entered By: Yevonne Pax on 01/14/2023 08:59:44 -------------------------------------------------------------------------------- Pain Assessment Details Patient Name: Date of Service: TANGANIKA, BARRADAS 01/14/2023 8:45 A M Medical Record Number: [id number] Patient Account Number: [id number] Date of Birth/Sex: Treating RN: 1941-04-26 (82 y.o. Freddy Finner Primary Care Klara Stjames: Darnelle Spangle Other Clinician: Referring Graceland Wachter: Treating Beola Vasallo/Extender: Paulina Fusi Weeks in Treatment: 9 Active Problems Location of Pain Severity and Description of  Pain Patient Has Paino No Site Locations Pain Management and Medication Current Pain Management: Electronic Signature(s) Signed: 01/14/2023 3:42:23 PM By: Yevonne Pax RN Entered By: Yevonne Pax on 01/14/2023 08:56:05 Peggyann Juba ([id number]) [id number] Page 7 of 9 -------------------------------------------------------------------------------- Patient/Caregiver Education Details Patient Name: Date of Service: ROSEBUD, KOENEN 7/18/2024andnbsp8:45 A M Medical Record Number: [id number] Patient Account Number: [id number] Date of Birth/Gender: Treating RN: 1940-07-05 (82 y.o. Freddy Finner Primary Care Physician: Darnelle Spangle Other Clinician: Referring Physician: Treating Physician/Extender: Paulina Fusi Weeks in Treatment: 9 Education Assessment Education Provided To: Patient Education Topics Provided Wound/Skin Impairment: Handouts: Caring for Your Ulcer Methods: Explain/Verbal Responses: State content correctly Electronic Signature(s) Signed: 01/14/2023 3:42:23 PM By: Yevonne Pax RN Entered By: Yevonne Pax on 01/14/2023 08:59:58 -------------------------------------------------------------------------------- Wound Assessment Details Patient Name: Date of Service: KEHAULANI, FRUIN 01/14/2023 8:45 A M Medical Record Number: [id number] Patient Account Number: [id number] Date of Birth/Sex: Treating RN: 12/24/1940 (82 y.o. Freddy Finner Primary Care Taijon Vink: Darnelle Spangle Other Clinician:  Referring Nancey Kreitz: Treating Ravi Tuccillo/Extender: Paulina Fusi Weeks in Treatment: 9 Wound Status Wound Number: 1 Primary Etiology: Diabetic Wound/Ulcer of the Lower Extremity Wound Location: Right, Medial Lower Leg Wound Status: Open Wounding Event: Trauma Comorbid History: Lymphedema, Hypertension, Type II Diabetes Date Acquired: 10/12/2022 Weeks Of Treatment: 9 Clustered Wound: No Photos TERI, LEGACY  ([id number]) [id number] Page 8 of 9 Wound Measurements Length: (cm) 0.4 Width: (cm) 0.4 Depth: (cm) 0.1 Area: (cm) 0.126 Volume: (cm) 0.013 % Reduction in Area: 98.3% % Reduction in Volume: 99.8% Epithelialization: None Tunneling: No Undermining: No Wound Description Classification: Grade 1 Exudate Amount: Medium Exudate Type: Serosanguineous Exudate Color: red, brown Foul Odor After Cleansing: No Slough/Fibrino No Wound Bed Granulation Amount: Large (67-100%) Exposed Structure Necrotic Amount: None Present (0%) Fascia Exposed: No Fat Layer (Subcutaneous Tissue) Exposed: Yes Tendon Exposed: No Muscle Exposed: No Joint Exposed: No Bone Exposed: No Treatment Notes Wound #1 (Lower Leg) Wound Laterality: Right, Medial Cleanser [place]ram Ancillary Kit - 15 Day Supply Discharge Instruction: Use supplies as instructed; Kit contains: (15) Saline Bullets; (15) 3x3 Gauze; 15 pr Gloves Soap and Water Discharge Instruction: Gently cleanse wound with antibacterial soap, rinse and pat dry prior to dressing wounds Wound Cleanser Discharge Instruction: Wash your hands with soap and water. Remove old dressing, discard into plastic bag and place into trash. Cleanse the wound with Wound Cleanser prior to applying a clean dressing using gauze sponges, not tissues or cotton balls. Do not scrub or use excessive force. Pat dry using gauze sponges, not tissue or cotton balls. Peri-Wound Care Topical Primary Dressing Promogran Matrix 4.34 (in) Discharge Instruction: Moisten w/normal saline or sterile water; Cover wound as directed. Do not remove from wound bed. Secondary Dressing (BORDER) Zetuvit Plus SILICONE BORDER Dressing 5x5 (in/in) Discharge Instruction: Please do not put silicone bordered dressings under wraps. Use non-bordered dressing only. Secured With Tubigrip Size D, 3x10 (in/yd) Discharge Instruction: double layer Compression Wrap Compression  Stockings Add-Ons Electronic Signature(s) Signed: 01/14/2023 3:42:23 PM By: Yevonne Pax RN Entered By: Yevonne Pax on 01/14/2023 08:58:42 Peggyann Juba ([id number]) [id number] Page 9 of 9 -------------------------------------------------------------------------------- Vitals Details Patient Name: Date of Service: JENASIS, STRALEY 01/14/2023 8:45 A M Medical Record Number: [id number] Patient Account Number: [id number] Date of Birth/Sex: Treating RN: 12-31-1940 (82 y.o. Freddy Finner Primary Care Kenyetta Wimbish: Darnelle Spangle Other Clinician: Referring Caedence Snowden: Treating Winslow Ederer/Extender: Paulina Fusi Weeks in Treatment: 9 Vital Signs Time Taken: 08:55 Temperature (F): 98 Height (in): 64 Pulse (bpm): 48 Weight (lbs): 190 Respiratory Rate (breaths/min): 18 Body Mass Index (BMI): 32.6 Blood Pressure (mmHg): 153/78 Reference Range: 80 - 120 mg / dl Electronic Signature(s) Signed: 01/14/2023 3:42:23 PM By: Yevonne Pax RN Entered By: Yevonne Pax on 01/14/2023 08:55:58

## 2023-01-19 ENCOUNTER — Encounter: Payer: Medicare PPO | Admitting: Internal Medicine

## 2023-01-19 DIAGNOSIS — E11622 Type 2 diabetes mellitus with other skin ulcer: Secondary | ICD-10-CM | POA: Diagnosis not present

## 2023-01-19 NOTE — Progress Notes (Signed)
[name], [name] ([id number]) [id number] Page 1 of 9 Visit Report for 01/19/2023 Arrival Information Details Patient Name: Date of Service: [name], [name] 01/19/2023 1:30 PM Medical Record Number: [id number] Patient Account Number: [id number] Date of Birth/Sex: Treating RN: [birth year] (82 y.o. Freddy Finner Primary Care Shavaughn Seidl: Darnelle Spangle Other Clinician: Referring Lauramae Kneisley: Treating Mallory Enriques/Extender: RO BSO N, MICHA EL Sharren Bridge, Edson Snowball Weeks in Treatment: 10 Visit Information History Since Last Visit Added or deleted any medications: No Patient Arrived: Ambulatory Any new allergies or adverse reactions: No Arrival Time: 13:35 Had a fall or experienced change in No Accompanied By: self activities of daily living that may affect Transfer Assistance: None risk of falls: Patient Identification Verified: Yes Signs or symptoms of abuse/neglect since last visito No Secondary Verification Process Completed: Yes Hospitalized since last visit: No Patient Requires Transmission-Based Precautions: No Implantable device outside of the clinic excluding No Patient Has Alerts: No cellular tissue based products placed in the center since last visit: Has Dressing in Place as Prescribed: Yes Pain Present Now: No Electronic Signature(s) Signed: 01/19/2023 4:16:14 PM By: Yevonne Pax RN Entered By: Yevonne Pax on 01/19/2023 13:36:08 -------------------------------------------------------------------------------- Clinic Level of Care Assessment Details Patient Name: Date of ServiceBIBI, ECONOMOS 01/19/2023 1:30 PM Medical Record Number: [id number] Patient Account Number: [id number] Date of Birth/Sex: Treating RN: 1941-05-25 (82 y.o. Freddy Finner Primary Care Jaymes Hang: Darnelle Spangle Other Clinician: Referring Zamzam Whinery: Treating Stepan Verrette/Extender: RO BSO N, MICHA EL Sharren Bridge, Edson Snowball Weeks in Treatment: 10 Clinic Level of Care Assessment  Items TOOL 4 Quantity Score X- 1 0 Use when only an EandM is performed on FOLLOW-UP visit ASSESSMENTS - Nursing Assessment / Reassessment X- 1 10 Reassessment of Co-morbidities (includes updates in patient status) X- 1 5 Reassessment of Adherence to Treatment Plan [name], [name] ([id number]) [id number] Page 2 of 9 ASSESSMENTS - Wound and Skin A ssessment / Reassessment X - Simple Wound Assessment / Reassessment - one wound 1 5 []  - 0 Complex Wound Assessment / Reassessment - multiple wounds []  - 0 Dermatologic / Skin Assessment (not related to wound area) ASSESSMENTS - Focused Assessment []  - 0 Circumferential Edema Measurements - multi extremities []  - 0 Nutritional Assessment / Counseling / Intervention []  - 0 Lower Extremity Assessment (monofilament, tuning fork, pulses) []  - 0 Peripheral Arterial Disease Assessment (using hand held doppler) ASSESSMENTS - Ostomy and/or Continence Assessment and Care []  - 0 Incontinence Assessment and Management []  - 0 Ostomy Care Assessment and Management (repouching, etc.) PROCESS - Coordination of Care X - Simple Patient / Family Education for ongoing care 1 15 []  - 0 Complex (extensive) Patient / Family Education for ongoing care []  - 0 Staff obtains Chiropractor, Records, T Results / Process Orders est []  - 0 Staff telephones HHA, Nursing Homes / Clarify orders / etc []  - 0 Routine Transfer to another Facility (non-emergent condition) []  - 0 Routine Hospital Admission (non-emergent condition) []  - 0 New Admissions / Manufacturing engineer / Ordering NPWT Apligraf, etc. , []  - 0 Emergency Hospital Admission (emergent condition) X- 1 10 Simple Discharge Coordination []  - 0 Complex (extensive) Discharge Coordination PROCESS - Special Needs []  - 0 Pediatric / Minor Patient Management []  - 0 Isolation Patient Management []  - 0 Hearing / Language / Visual special needs []  - 0 Assessment of  Community assistance (transportation, D/C planning, etc.) []  - 0 Additional assistance / Altered mentation []  - 0 Support Surface(s) Assessment (bed, cushion, seat, etc.) INTERVENTIONS - Wound Cleansing / Measurement X -  Simple Wound Cleansing - one wound 1 5 []  - 0 Complex Wound Cleansing - multiple wounds X- 1 5 Wound Imaging (photographs - any number of wounds) []  - 0 Wound Tracing (instead of photographs) X- 1 5 Simple Wound Measurement - one wound []  - 0 Complex Wound Measurement - multiple wounds INTERVENTIONS - Wound Dressings X - Small Wound Dressing one or multiple wounds 1 10 []  - 0 Medium Wound Dressing one or multiple wounds []  - 0 Large Wound Dressing one or multiple wounds X- 1 5 Application of Medications - topical []  - 0 Application of Medications - injection INTERVENTIONS - Miscellaneous []  - 0 External ear exam [name], [name] ([id number]) [phone].pdf Page 3 of 9 []  - 0 Specimen Collection (cultures, biopsies, blood, body fluids, etc.) []  - 0 Specimen(s) / Culture(s) sent or taken to Lab for analysis []  - 0 Patient Transfer (multiple staff / Michiel Sites Lift / Similar devices) []  - 0 Simple Staple / Suture removal (25 or less) []  - 0 Complex Staple / Suture removal (26 or more) []  - 0 Hypo / Hyperglycemic Management (close monitor of Blood Glucose) []  - 0 Ankle / Brachial Index (ABI) - do not check if billed separately X- 1 5 Vital Signs Has the patient been seen at the hospital within the last three years: Yes Total Score: 80 Level Of Care: New/Established - Level 3 Electronic Signature(s) Signed: 01/19/2023 4:16:14 PM By: Yevonne Pax RN Entered By: Yevonne Pax on 01/19/2023 15:20:48 -------------------------------------------------------------------------------- Encounter Discharge Information Details Patient Name: Date of Service: [name], [name] 01/19/2023 1:30 PM Medical Record Number: [id number] Patient  Account Number: [id number] Date of Birth/Sex: Treating RN: 1940/08/30 (82 y.o. Freddy Finner Primary Care Coltin Casher: Darnelle Spangle Other Clinician: Referring Sheniya Garciaperez: Treating Marks Scalera/Extender: RO BSO N, MICHA EL Assunta Gambles Weeks in Treatment: 10 Encounter Discharge Information Items Discharge Condition: Stable Ambulatory Status: Ambulatory Discharge Destination: Home Transportation: Private Auto Accompanied By: self Schedule Follow-up Appointment: Yes Clinical Summary of Care: Electronic Signature(s) Signed: 01/19/2023 2:12:18 PM By: Yevonne Pax RN Entered By: Yevonne Pax on 01/19/2023 14:12:18 -------------------------------------------------------------------------------- Lower Extremity Assessment Details Patient Name: Date of Service: [name], [name] 01/19/2023 1:30 PM Peggyann Juba ([id number]) [phone].pdf Page 4 of 9 Medical Record Number: [id number] Patient Account Number: [id number] Date of Birth/Sex: Treating RN: 26-Nov-1940 (82 y.o. Freddy Finner Primary Care Jodee Wagenaar: Darnelle Spangle Other Clinician: Referring Lavere Stork: Treating Kekoa Fyock/Extender: RO BSO N, MICHA EL Sharren Bridge, Edson Snowball Weeks in Treatment: 10 Edema Assessment Assessed: [Left: No] [Right: No] Edema: [Left: Ye] [Right: s] Calf Left: Right: Point of Measurement: 36 cm From Medial Instep 36 cm Ankle Left: Right: Point of Measurement: 11 cm From Medial Instep 23 cm Vascular Assessment Pulses: Dorsalis Pedis Palpable: [Right:Yes] Electronic Signature(s) Signed: 01/19/2023 4:16:14 PM By: Yevonne Pax RN Entered By: Yevonne Pax on 01/19/2023 13:40:30 -------------------------------------------------------------------------------- Multi Wound Chart Details Patient Name: Date of Service: [name], Troyce 01/19/2023 1:30 PM Medical Record Number: [id number] Patient Account Number: [id number] Date of Birth/Sex: Treating RN: 1940/11/13 (82 y.o. Freddy Finner Primary Care Debralee Braaksma: Darnelle Spangle Other Clinician: Referring Moncerrath Berhe: Treating Zai Chmiel/Extender: RO BSO N, MICHA EL Sharren Bridge, Shana Weeks in Treatment: 10 Vital Signs Height(in): 64 Pulse(bpm): 48 Weight(lbs): 190 Blood Pressure(mmHg): 139/73 Body Mass Index(BMI): 32.6 Temperature(F): 97.6 Respiratory Rate(breaths/min): 16 [1:Photos:] [N/A:N/A] Right, Medial Lower Leg N/A N/A Wound Location: Trauma N/A N/A Wounding Event: Diabetic Wound/Ulcer of the Lower N/A N/A Primary EtiologyRAZIYAH, VANVLECK ([id number]) [id number] Page 5 of 9 Extremity Lymphedema, Hypertension, Type  II N/A N/A Comorbid History: Diabetes 10/12/2022 N/A N/A Date Acquired: 10 N/A N/A Weeks of Treatment: Open N/A N/A Wound Status: No N/A N/A Wound Recurrence: 0.2x0.2x0.1 N/A N/A Measurements L x W x D (cm) 0.031 N/A N/A A (cm) : rea 0.003 N/A N/A Volume (cm) : 99.60% N/A N/A % Reduction in A rea: 100.00% N/A N/A % Reduction in Volume: Grade 1 N/A N/A Classification: Medium N/A N/A Exudate A mount: Serosanguineous N/A N/A Exudate Type: red, brown N/A N/A Exudate Color: Large (67-100%) N/A N/A Granulation A mount: None Present (0%) N/A N/A Necrotic A mount: Fat Layer (Subcutaneous Tissue): Yes N/A N/A Exposed Structures: Fascia: No Tendon: No Muscle: No Joint: No Bone: No None N/A N/A Epithelialization: Treatment Notes Electronic Signature(s) Signed: 01/19/2023 4:16:14 PM By: Yevonne Pax RN Entered By: Yevonne Pax on 01/19/2023 13:40:55 -------------------------------------------------------------------------------- Multi-Disciplinary Care Plan Details Patient Name: Date of Service: [name], [place] 01/19/2023 1:30 PM Medical Record Number: [id number] Patient Account Number: [id number] Date of Birth/Sex: Treating RN: [birth year] (82 y.o. Freddy Finner Primary Care Tnya Ades: Darnelle Spangle Other Clinician: Referring  Calogero Geisen: Treating Alanya Vukelich/Extender: RO BSO N, MICHA EL Sharren Bridge, Edson Snowball Weeks in Treatment: 10 Active Inactive Wound/Skin Impairment Nursing Diagnoses: Knowledge deficit related to ulceration/compromised skin integrity Goals: Patient/caregiver will verbalize understanding of skin care regimen Date Initiated: 11/09/2022 Target Resolution Date: 02/09/2023 Goal Status: Active Ulcer/skin breakdown will have a volume reduction of 30% by week 4 Date Initiated: 11/09/2022 Date Inactivated: 12/14/2022 Target Resolution Date: 12/10/2022 Goal Status: Unmet Unmet Reason: comorbidities Ulcer/skin breakdown will have a volume reduction of 50% by week 8 Date Initiated: 11/09/2022 Date Inactivated: 01/14/2023 Target Resolution Date: 01/09/2023 Goal Status: Met Ulcer/skin breakdown will have a volume reduction of 80% by week 12 Date Initiated: 11/09/2022 Target Resolution Date: 02/09/2023 Goal Status: Active [name], [name] ([id number]) [id number] Page 6 of 9 Ulcer/skin breakdown will heal within 14 weeks Date Initiated: 11/09/2022 Target Resolution Date: 03/12/2023 Goal Status: Active Interventions: Assess patient/caregiver ability to obtain necessary supplies Assess patient/caregiver ability to perform ulcer/skin care regimen upon admission and as needed Assess ulceration(s) every visit Notes: Electronic Signature(s) Signed: 01/19/2023 4:16:14 PM By: Yevonne Pax RN Entered By: Yevonne Pax on 01/19/2023 13:41:16 -------------------------------------------------------------------------------- Pain Assessment Details Patient Name: Date of ServiceHARLYNN, [name] 01/19/2023 1:30 PM Medical Record Number: [id number] Patient Account Number: [id number] Date of Birth/Sex: Treating RN: [birth year] (83 y.o. Freddy Finner Primary Care Amonie Wisser: Darnelle Spangle Other Clinician: Referring Ivianna Notch: Treating Ki Luckman/Extender: RO BSO N, MICHA EL Sharren Bridge, Edson Snowball Weeks in  Treatment: 10 Active Problems Location of Pain Severity and Description of Pain Patient Has Paino No Site Locations Pain Management and Medication Current Pain Management: Electronic Signature(s) Signed: 01/19/2023 4:16:14 PM By: Yevonne Pax RN Entered By: Yevonne Pax on 01/19/2023 13:36:36 Peggyann Juba ([id number]) [phone].pdf Page 7 of 9 -------------------------------------------------------------------------------- Patient/Caregiver Education Details Patient Name: Date of Service: [name], [name] 7/23/2024andnbsp1:30 PM Medical Record Number: [id number] Patient Account Number: [id number] Date of Birth/Gender: Treating RN: [birth year] (82 y.o. Freddy Finner Primary Care Physician: Darnelle Spangle Other Clinician: Referring Physician: Treating Physician/Extender: RO BSO Dorris Carnes, MICHA EL Assunta Gambles Weeks in Treatment: 10 Education Assessment Education Provided To: Patient Education Topics Provided Wound/Skin Impairment: Handouts: Caring for Your Ulcer Methods: Printed Responses: State content correctly Electronic Signature(s) Signed: 01/19/2023 4:16:14 PM By: Yevonne Pax RN Entered By: Yevonne Pax on 01/19/2023 13:41:28 -------------------------------------------------------------------------------- Wound Assessment Details Patient Name: Date of Service: [name], [name] 01/19/2023 1:30 PM Medical Record Number: [id number] Patient Account  Number: [id number] Date of Birth/Sex: Treating RN: 09-24-1940 (82 y.o. Freddy Finner Primary Care Miranda Frese: Darnelle Spangle Other Clinician: Referring Martez Weiand: Treating Laszlo Ellerby/Extender: RO BSO N, MICHA EL Sharren Bridge, Edson Snowball Weeks in Treatment: 10 Wound Status Wound Number: 1 Primary Etiology: Diabetic Wound/Ulcer of the Lower Extremity Wound Location: Right, Medial Lower Leg Wound Status: Open Wounding Event: Trauma Comorbid History: Lymphedema, Hypertension, Type II Diabetes Date Acquired:  10/12/2022 Weeks Of Treatment: 10 Clustered Wound: No Photos [name], [name] ([id number]) [id number] Page 8 of 9 Wound Measurements Length: (cm) 0.2 Width: (cm) 0.2 Depth: (cm) 0.1 Area: (cm) 0.031 Volume: (cm) 0.003 % Reduction in Area: 99.6% % Reduction in Volume: 100% Epithelialization: None Tunneling: No Undermining: No Wound Description Classification: Grade 1 Exudate Amount: Medium Exudate Type: Serosanguineous Exudate Color: red, brown Foul Odor After Cleansing: No Slough/Fibrino No Wound Bed Granulation Amount: Large (67-100%) Exposed Structure Necrotic Amount: None Present (0%) Fascia Exposed: No Fat Layer (Subcutaneous Tissue) Exposed: Yes Tendon Exposed: No Muscle Exposed: No Joint Exposed: No Bone Exposed: No Treatment Notes Wound #1 (Lower Leg) Wound Laterality: Right, Medial Cleanser [place] Ancillary Kit - 15 Day Supply Discharge Instruction: Use supplies as instructed; Kit contains: (15) Saline Bullets; (15) 3x3 Gauze; 15 pr Gloves Soap and Water Discharge Instruction: Gently cleanse wound with antibacterial soap, rinse and pat dry prior to dressing wounds Wound Cleanser Discharge Instruction: Wash your hands with soap and water. Remove old dressing, discard into plastic bag and place into trash. Cleanse the wound with Wound Cleanser prior to applying a clean dressing using gauze sponges, not tissues or cotton balls. Do not scrub or use excessive force. Pat dry using gauze sponges, not tissue or cotton balls. Peri-Wound Care Topical Primary Dressing Promogran Matrix 4.34 (in) Discharge Instruction: Moisten w/normal saline or sterile water; Cover wound as directed. Do not remove from wound bed. Secondary Dressing (BORDER) Zetuvit Plus SILICONE BORDER Dressing 5x5 (in/in) Discharge Instruction: Please do not put silicone bordered dressings under wraps. Use non-bordered dressing only. Secured With Tubigrip Size D, 3x10  (in/yd) Discharge Instruction: double layer Compression Wrap Compression Stockings Add-Ons Electronic Signature(s) Signed: 01/19/2023 4:16:14 PM By: Yevonne Pax RN Entered By: Yevonne Pax on 01/19/2023 13:40:47 Peggyann Juba ([id number]) [phone].pdf Page 9 of 9 -------------------------------------------------------------------------------- Vitals Details Patient Name: Date of Service: [name], [name] 01/19/2023 1:30 PM Medical Record Number: [id number] Patient Account Number: [id number] Date of Birth/Sex: Treating RN: 07-11-1940 (82 y.o. Freddy Finner Primary Care Efren Kross: Darnelle Spangle Other Clinician: Referring Brenna Friesenhahn: Treating Marki Frede/Extender: RO BSO N, MICHA EL Sharren Bridge, Shana Weeks in Treatment: 10 Vital Signs Time Taken: 13:36 Temperature (F): 97.6 Height (in): 64 Pulse (bpm): 48 Weight (lbs): 190 Respiratory Rate (breaths/min): 16 Body Mass Index (BMI): 32.6 Blood Pressure (mmHg): 139/73 Reference Range: 80 - 120 mg / dl Electronic Signature(s) Signed: 01/19/2023 4:16:14 PM By: Yevonne Pax RN Entered By: Yevonne Pax on 01/19/2023 13:36:30

## 2023-01-19 NOTE — Progress Notes (Signed)
LESHAY, DESAULNIERS ([id number]) [id number] Page 1 of 6 Visit Report for 01/19/2023 HPI Details Patient Name: Date of Service: [name], [name] 01/19/2023 1:30 PM Medical Record Number: [id number] Patient Account Number: [id number] Date of Birth/Sex: Treating RN: June 21, 1941 (82 y.o. [name] Primary Care Provider: Darnelle Spangle Other Clinician: Referring Provider: Treating Provider/Extender: RO BSO N, MICHA EL Assunta Gambles Weeks in Treatment: 10 History of Present Illness HPI Description: 11-09-2022 upon evaluation today patient appears to be doing somewhat poorly in regard to her wound on her right medial lower extremity. This occurred as a result of the trauma initially which I think turned into more of an abscess. This does have some tracking underneath she has 3 openings all which are part of the same wound. I am unsure if this is going end up opening up into the entire region or not but time will tell as far as that is concerned. With that being said right now she has been on doxycycline earlier in April she is out of the mupirocin at this point. With that being said I think she may need to be back on the doxycycline in order to ensure especially after I cleaned this up today that we do not worsen anything from an infection standpoint. Patient does have a history of chronic venous hypertension, hypertension, and diabetes mellitus type 2. Her most recent hemoglobin A1c was 5.9 on October 20, 2022 11-16-2022 upon evaluation today patient appears to be doing better in regard to her wound which is actually slowly started to look improved as far as the overall appearance of the wound bed is concerned. Fortunately I do not see any signs of active infection locally or systemically which is great news and overall I am extremely pleased with where things stand currently. 11-24-2022 upon evaluation today patient appears to be doing well currently in regard to her  leg ulcer. She has been making some progress here. Some of the skin is thinned out between the openings and I think we probably need to open this up some of these more confluent area versus the spider openings in multiple areas. 11-30-2022 upon evaluation today patient appears to be doing well currently in regard to her wound she has been tolerating the dressing changes without complication. With that being said I do think that we are making pretty good progress here which is great news. I do not see any evidence of active infection locally nor systemically which is excellent as well. 12-07-2022 upon evaluation today patient actually appears to be making some great improvements here in regard to her wound. In fact this is showing signs of good granulation and there is really no need for sharp debridement today which is great news as well. 12-14-2022 upon evaluation today patient appears to be doing well currently in regard to her wounds she is actually showing some signs of improvement and I am very pleased in that regard. The undermining areas and tunneling is actually dramatically improved compared to what it was. 12-22-2022 upon evaluation today patient appears to be doing well currently in regard to her wounds which are actually showing signs of significant improvement. I am actually very pleased with where we stand I think she is making great progress. 12-29-2022 upon evaluation today patient appears to be doing well currently in regard to her wound in fact this is showing signs of excellent improvement she seems to be very close to complete resolution. 01-05-2023 upon evaluation today patient appears to be doing excellent in  regard to her leg ulcer. She has been tolerating the dressing changes without complication and in general I do believe that we are making excellent headway towards complete closure. This wound is dramatically improved from where she started. 01-14-2023 upon evaluation today patient  appears to be doing well currently in regard to her wound. She has been tolerating dressing changes without complication. Fortunately there does not appear to be any signs of active infection locally or systemically at this time which is great news. No fevers, chills, nausea, vomiting, or diarrhea. 7/23; small traumatic wound on the right medial lower leg. This is about half the size of last week's visit. We have been using Prisma and Tubigrip. The patient is a diabetic probably has some degree of chronic venous insufficiency. She does not have a prior history of difficult to heal wounds Electronic Signature(s) Signed: 01/19/2023 4:43:46 PM By: Baltazar Najjar MD Entered By: Baltazar Najjar on 01/19/2023 13:55:03 Peggyann Juba ([id number]) [id number].pdf Page 2 of 6 -------------------------------------------------------------------------------- Physical Exam Details Patient Name: Date of ServiceRHEAGAN, [name] 01/19/2023 1:30 PM Medical Record Number: [id number] Patient Account Number: [id number] Date of Birth/Sex: Treating RN: 1940/07/26 (82 y.o. [name] Primary Care Provider: Darnelle Spangle Other Clinician: Referring Provider: Treating Provider/Extender: RO BSO N, MICHA EL Sharren Bridge, Shana Weeks in Treatment: 10 Constitutional Sitting or standing Blood Pressure is within target range for patient.. Pulse regular and within target range for patient.Marland Kitchen Respirations regular, non-labored and within target range.. Temperature is normal and within the target range for the patient.Marland Kitchen appears in no distress. Notes Wound exam; right medial lower leg everything is closed here except for a very small open area and even this area is about 50% of the surface area of the wound measured last week. The skin and this is fairly dry and flaky. Perhaps some signs of venous insufficiency Electronic Signature(s) Signed: 01/19/2023 4:43:46 PM By: Baltazar Najjar MD Entered  By: Baltazar Najjar on 01/19/2023 13:55:55 -------------------------------------------------------------------------------- Physician Orders Details Patient Name: Date of Service: LAKEYTA, VANDENHEUVEL 01/19/2023 1:30 PM Medical Record Number: [id number] Patient Account Number: [id number] Date of Birth/Sex: Treating RN: 1940/11/12 (82 y.o. [name] Primary Care Provider: Darnelle Spangle Other Clinician: Referring Provider: Treating Provider/Extender: RO BSO N, MICHA EL Sharren [place], Edson Snowball Weeks in Treatment: 10 Verbal / Phone Orders: No Diagnosis Coding ICD-10 Coding Code Description E11.622 Type 2 diabetes mellitus with other skin ulcer I87.331 Chronic venous hypertension (idiopathic) with ulcer and inflammation of right lower extremity L97.812 Non-pressure chronic ulcer of other part of right lower leg with fat layer exposed I10 Essential (primary) hypertension Follow-up Appointments Return Appointment in 1 week. Bathing/ Shower/ Hygiene May shower with wound dressing protected with water repellent cover or cast protector. No tub bath. Anesthetic (Use 'Patient Medications' Section for Anesthetic Order Entry) ANALIZ, TVEDT ([id number]) [id number] Page 3 of 6 Lidocaine applied to wound bed Edema Control - Lymphedema / Segmental Compressive Device / Other Elevate, Exercise Daily and A void Standing for Long Periods of Time. Elevate legs to the level of the heart and pump ankles as often as possible Elevate leg(s) parallel to the floor when sitting. Wound Treatment Wound #1 - Lower Leg Wound Laterality: Right, Medial Cleanser: Byram Ancillary Kit - 15 Day Supply (Generic) 3 x Per Week/30 Days Discharge Instructions: Use supplies as instructed; Kit contains: (15) Saline Bullets; (15) 3x3 Gauze; 15 pr Gloves Cleanser: Soap and Water 3 x Per Week/30 Days Discharge Instructions: Gently cleanse wound with antibacterial soap, rinse and pat  dry prior to  dressing wounds Cleanser: Wound Cleanser (Generic) 3 x Per Week/30 Days Discharge Instructions: Wash your hands with soap and water. Remove old dressing, discard into plastic bag and place into trash. Cleanse the wound with Wound Cleanser prior to applying a clean dressing using gauze sponges, not tissues or cotton balls. Do not scrub or use excessive force. Pat dry using gauze sponges, not tissue or cotton balls. Prim Dressing: Promogran Matrix 4.34 (in) 3 x Per Week/30 Days ary Discharge Instructions: Moisten w/normal saline or sterile water; Cover wound as directed. Do not remove from wound bed. Secondary Dressing: (BORDER) Zetuvit Plus SILICONE BORDER Dressing 5x5 (in/in) (Generic) 3 x Per Week/30 Days Discharge Instructions: Please do not put silicone bordered dressings under wraps. Use non-bordered dressing only. Secured With: Tubigrip Size D, 3x10 (in/yd) 3 x Per Week/30 Days Discharge Instructions: double layer Electronic Signature(s) Signed: 01/19/2023 2:10:30 PM By: Yevonne Pax RN Signed: 01/19/2023 4:43:46 PM By: Baltazar Najjar MD Entered By: Yevonne Pax on 01/19/2023 14:10:30 -------------------------------------------------------------------------------- Problem List Details Patient Name: Date of Service: [name], Tailer 01/19/2023 1:30 PM Medical Record Number: [id number] Patient Account Number: [id number] Date of Birth/Sex: Treating RN: [birth year] (82 y.o. [name] Primary Care Provider: Darnelle Spangle Other Clinician: Referring Provider: Treating Provider/Extender: RO BSO N, MICHA EL Assunta Gambles Weeks in Treatment: 10 Active Problems ICD-10 Encounter Code Description Active Date MDM Diagnosis E11.622 Type 2 diabetes mellitus with other skin ulcer 11/09/2022 No Yes I87.331 Chronic venous hypertension (idiopathic) with ulcer and inflammation of right 11/09/2022 No Yes lower extremity L97.812 Non-pressure chronic ulcer of other part of right lower leg with  fat layer 11/09/2022 No Yes exposed PALLAVI, CLIFTON ([id number]) [phone].pdf Page 4 of 6 I10 Essential (primary) hypertension 11/09/2022 No Yes Inactive Problems Resolved Problems Electronic Signature(s) Signed: 01/19/2023 4:43:46 PM By: Baltazar Najjar MD Entered By: Baltazar Najjar on 01/19/2023 13:54:12 -------------------------------------------------------------------------------- Progress Note Details Patient Name: Date of Service: [name], Reather 01/19/2023 1:30 PM Medical Record Number: [id number] Patient Account Number: [id number] Date of Birth/Sex: Treating RN: Aug 03, 1940 (82 y.o. [name] Primary Care Provider: Darnelle Spangle Other Clinician: Referring Provider: Treating Provider/Extender: RO BSO N, MICHA EL Sharren [place], Edson Snowball Weeks in Treatment: 10 Subjective History of Present Illness (HPI) 11-09-2022 upon evaluation today patient appears to be doing somewhat poorly in regard to her wound on her right medial lower extremity. This occurred as a result of the trauma initially which I think turned into more of an abscess. This does have some tracking underneath she has 3 openings all which are part of the same wound. I am unsure if this is going end up opening up into the entire region or not but time will tell as far as that is concerned. With that being said right now she has been on doxycycline earlier in April she is out of the mupirocin at this point. With that being said I think she may need to be back on the doxycycline in order to ensure especially after I cleaned this up today that we do not worsen anything from an infection standpoint. Patient does have a history of chronic venous hypertension, hypertension, and diabetes mellitus type 2. Her most recent hemoglobin A1c was 5.9 on October 20, 2022 11-16-2022 upon evaluation today patient appears to be doing better in regard to her wound which is actually slowly started to look improved as  far as the overall appearance of the wound bed is concerned. Fortunately I do not see any signs  of active infection locally or systemically which is great news and overall I am extremely pleased with where things stand currently. 11-24-2022 upon evaluation today patient appears to be doing well currently in regard to her leg ulcer. She has been making some progress here. Some of the skin is thinned out between the openings and I think we probably need to open this up some of these more confluent area versus the spider openings in multiple areas. 11-30-2022 upon evaluation today patient appears to be doing well currently in regard to her wound she has been tolerating the dressing changes without complication. With that being said I do think that we are making pretty good progress here which is great news. I do not see any evidence of active infection locally nor systemically which is excellent as well. 12-07-2022 upon evaluation today patient actually appears to be making some great improvements here in regard to her wound. In fact this is showing signs of good granulation and there is really no need for sharp debridement today which is great news as well. 12-14-2022 upon evaluation today patient appears to be doing well currently in regard to her wounds she is actually showing some signs of improvement and I am very pleased in that regard. The undermining areas and tunneling is actually dramatically improved compared to what it was. 12-22-2022 upon evaluation today patient appears to be doing well currently in regard to her wounds which are actually showing signs of significant improvement. I am actually very pleased with where we stand I think she is making great progress. 12-29-2022 upon evaluation today patient appears to be doing well currently in regard to her wound in fact this is showing signs of excellent improvement she seems to be very close to complete resolution. 01-05-2023 upon evaluation today  patient appears to be doing excellent in regard to her leg ulcer. She has been tolerating the dressing changes without complication and in general I do believe that we are making excellent headway towards complete closure. This wound is dramatically improved from where she started. 01-14-2023 upon evaluation today patient appears to be doing well currently in regard to her wound. She has been tolerating dressing changes without complication. Fortunately there does not appear to be any signs of active infection locally or systemically at this time which is great news. No fevers, chills, nausea, vomiting, or diarrhea. 7/23; small traumatic wound on the right medial lower leg. This is about half the size of last week's visit. We have been using Prisma and Tubigrip. The patient AILED, DEFIBAUGH ([id number]) [id number] Page 5 of 6 is a diabetic probably has some degree of chronic venous insufficiency. She does not have a prior history of difficult to heal wounds Objective Constitutional Sitting or standing Blood Pressure is within target range for patient.. Pulse regular and within target range for patient.Marland Kitchen Respirations regular, non-labored and within target range.. Temperature is normal and within the target range for the patient.Marland Kitchen appears in no distress. Vitals Time Taken: 1:36 PM, Height: 64 in, Weight: 190 lbs, BMI: 32.6, Temperature: 97.6 F, Pulse: 48 bpm, Respiratory Rate: 16 breaths/min, Blood Pressure: 139/73 mmHg. General Notes: Wound exam; right medial lower leg everything is closed here except for a very small open area and even this area is about 50% of the surface area of the wound measured last week. The skin and this is fairly dry and flaky. Perhaps some signs of venous insufficiency Integumentary (Hair, Skin) Wound #1 status is Open. Original cause of wound was  Trauma. The date acquired was: 10/12/2022. The wound has been in treatment 10 weeks. The wound  is located on the Right,Medial Lower Leg. The wound measures 0.2cm length x 0.2cm width x 0.1cm depth; 0.031cm^2 area and 0.003cm^3 volume. There is Fat Layer (Subcutaneous Tissue) exposed. There is no tunneling or undermining noted. There is a medium amount of serosanguineous drainage noted. There is large (67-100%) granulation within the wound bed. There is no necrotic tissue within the wound bed. Assessment Active Problems ICD-10 Type 2 diabetes mellitus with other skin ulcer Chronic venous hypertension (idiopathic) with ulcer and inflammation of right lower extremity Non-pressure chronic ulcer of other part of right lower leg with fat layer exposed Essential (primary) hypertension Plan 1. I have not changed the primary dressing which is Prisma # Tubigrip her husband is changing the dressing. This should be healed by next week 2. She does not have a prior history of wounds, minor changes of what looks to be chronic venous insufficiency other than skin moisturizing probably does not require compression stockings at this point. Electronic Signature(s) Signed: 01/19/2023 4:43:46 PM By: Baltazar Najjar MD Entered By: Baltazar Najjar on 01/19/2023 13:57:03 -------------------------------------------------------------------------------- SuperBill Details Patient Name: Date of Service: MACKENZEE, BECVAR 01/19/2023 Medical Record Number: [id number] Patient Account Number: [id number] Date of Birth/Sex: Treating RN: [birth year] (82 y.o. [name] Primary Care Provider: Darnelle Spangle Other Clinician: Referring Provider: Treating Provider/Extender: Chauncey Mann, MICHA EL Assunta Gambles Weeks in Treatment: 10 [place], [place] ([id number]) [id number] Page 6 of 6 Diagnosis Coding ICD-10 Codes Code Description E11.622 Type 2 diabetes mellitus with other skin ulcer I87.331 Chronic venous hypertension (idiopathic) with ulcer and inflammation of right lower  extremity L97.812 Non-pressure chronic ulcer of other part of right lower leg with fat layer exposed I10 Essential (primary) hypertension Facility Procedures : CPT4 Code: [id number] Description: 99213 - WOUND CARE VISIT-LEV 3 EST PT Modifier: Quantity: 1 Physician Procedures : CPT4 Code Description Modifier [id number] - WC PHYS LEVEL 3 - EST PT ICD-10 Diagnosis Description I87.331 Chronic venous hypertension (idiopathic) with ulcer and inflammation of right lower extremity L97.812 Non-pressure chronic ulcer of other part  of right lower leg with fat layer exposed E11.622 Type 2 diabetes mellitus with other skin ulcer Quantity: 1 Electronic Signature(s) Signed: 01/19/2023 3:21:14 PM By: Yevonne Pax RN Signed: 01/19/2023 4:43:46 PM By: Baltazar Najjar MD Previous Signature: 01/19/2023 2:11:03 PM Version By: Yevonne Pax RN Entered By: Yevonne Pax on 01/19/2023 15:21:14

## 2023-01-26 ENCOUNTER — Ambulatory Visit: Payer: Medicare PPO | Admitting: Physician Assistant

## 2023-01-28 ENCOUNTER — Encounter: Payer: Medicare PPO | Attending: Physician Assistant | Admitting: Physician Assistant

## 2023-01-28 DIAGNOSIS — I10 Essential (primary) hypertension: Secondary | ICD-10-CM | POA: Insufficient documentation

## 2023-01-28 DIAGNOSIS — E11622 Type 2 diabetes mellitus with other skin ulcer: Secondary | ICD-10-CM | POA: Diagnosis present

## 2023-01-28 DIAGNOSIS — L97812 Non-pressure chronic ulcer of other part of right lower leg with fat layer exposed: Secondary | ICD-10-CM | POA: Insufficient documentation

## 2023-01-28 DIAGNOSIS — I87331 Chronic venous hypertension (idiopathic) with ulcer and inflammation of right lower extremity: Secondary | ICD-10-CM | POA: Insufficient documentation

## 2023-01-28 NOTE — Progress Notes (Addendum)
MAIKOU, MOLINA ([id number]) [id number] Page 1 of 7 Visit Report for 01/28/2023 Chief Complaint Document Details Patient Name: Date of Service: [name], [name] 01/28/2023 2:30 PM Medical Record Number: [id number] Patient Account Number: [id number] Date of Birth/Sex: Treating RN: [birth year] (82 y.o. Freddy Finner Primary Care Provider: Darnelle Spangle Other Clinician: Referring Provider: Treating Provider/Extender: Paulina Fusi Weeks in Treatment: 11 Information Obtained from: Patient Chief Complaint Right LE Ulcer Electronic Signature(s) Signed: 01/28/2023 2:33:16 PM By: Allen Derry PA-C Entered By: Allen Derry on 01/28/2023 14:33:16 -------------------------------------------------------------------------------- HPI Details Patient Name: Date of Service: [name], [name] 01/28/2023 2:30 PM Medical Record Number: [id number] Patient Account Number: [id number] Date of Birth/Sex: Treating RN: [birth year] (82 y.o. Freddy Finner Primary Care Provider: Darnelle Spangle Other Clinician: Referring Provider: Treating Provider/Extender: Paulina Fusi Weeks in Treatment: 11 History of Present Illness HPI Description: 11-09-2022 upon evaluation today patient appears to be doing somewhat poorly in regard to her wound on her right medial lower extremity. This occurred as a result of the trauma initially which I think turned into more of an abscess. This does have some tracking underneath she has 3 openings all which are part of the same wound. I am unsure if this is going end up opening up into the entire region or not but time will tell as far as that is concerned. With that being said right now she has been on doxycycline earlier in April she is out of the mupirocin at this point. With that being said I think she may need to be back on the doxycycline in order to ensure especially after I cleaned this up today that we do not worsen anything  from an infection standpoint. Patient does have a history of chronic venous hypertension, hypertension, and diabetes mellitus type 2. Her most recent hemoglobin A1c was 5.9 on October 20, 2022 11-16-2022 upon evaluation today patient appears to be doing better in regard to her wound which is actually slowly started to look improved as far as the overall appearance of the wound bed is concerned. Fortunately I do not see any signs of active infection locally or systemically which is great news and overall I am extremely pleased with where things stand currently. 11-24-2022 upon evaluation today patient appears to be doing well currently in regard to her leg ulcer. She has been making some progress here. Some of the skin is thinned out between the openings and I think we probably need to open this up some of these more confluent area versus the spider openings in multiple areas. [name], [name] ([id number]) [id number] Page 2 of 7 11-30-2022 upon evaluation today patient appears to be doing well currently in regard to her wound she has been tolerating the dressing changes without complication. With that being said I do think that we are making pretty good progress here which is great news. I do not see any evidence of active infection locally nor systemically which is excellent as well. 12-07-2022 upon evaluation today patient actually appears to be making some great improvements here in regard to her wound. In fact this is showing signs of good granulation and there is really no need for sharp debridement today which is great news as well. 12-14-2022 upon evaluation today patient appears to be doing well currently in regard to her wounds she is actually showing some signs of improvement and I am very pleased in that regard. The undermining areas and tunneling is actually dramatically improved compared to what it was. 12-22-2022  upon evaluation today patient appears to be doing  well currently in regard to her wounds which are actually showing signs of significant improvement. I am actually very pleased with where we stand I think she is making great progress. 12-29-2022 upon evaluation today patient appears to be doing well currently in regard to her wound in fact this is showing signs of excellent improvement she seems to be very close to complete resolution. 01-05-2023 upon evaluation today patient appears to be doing excellent in regard to her leg ulcer. She has been tolerating the dressing changes without complication and in general I do believe that we are making excellent headway towards complete closure. This wound is dramatically improved from where she started. 01-14-2023 upon evaluation today patient appears to be doing well currently in regard to her wound. She has been tolerating dressing changes without complication. Fortunately there does not appear to be any signs of active infection locally or systemically at this time which is great news. No fevers, chills, nausea, vomiting, or diarrhea. 7/23; small traumatic wound on the right medial lower leg. This is about half the size of last week's visit. We have been using Prisma and Tubigrip. The patient is a diabetic probably has some degree of chronic venous insufficiency. She does not have a prior history of difficult to heal wounds 01-28-2023 upon evaluation today patient appears to be doing excellent in regard to her wound area in fact I think this is pretty much completely healed. Fortunately I do not see any signs of active infection locally or systemically which is great news and in general I do believe that making excellent headway towards complete closure. Electronic Signature(s) Signed: 01/28/2023 2:56:19 PM By: Allen Derry PA-C Entered By: Allen Derry on 01/28/2023 14:56:19 -------------------------------------------------------------------------------- Physical Exam Details Patient Name: Date of Service: [name], [name] 01/28/2023 2:30 PM Medical Record Number: [id number] Patient Account Number: [id number] Date of Birth/Sex: Treating RN: [birth year] (82 y.o. Freddy Finner Primary Care Provider: Darnelle Spangle Other Clinician: Referring Provider: Treating Provider/Extender: Paulina Fusi Weeks in Treatment: 11 Constitutional Well-nourished and well-hydrated in no acute distress. Respiratory normal breathing without difficulty. Psychiatric this patient is able to make decisions and demonstrates good insight into disease process. Alert and Oriented x 3. pleasant and cooperative. Notes Upon inspection patient's wound again is pretty much healed and may be just a tiny pinpoint area still draining but this is minimal at this time. I do not see any signs of worsening infection overall I think that she is really doing quite also not at this point. I do think some AandD ointment could be beneficial for her. Electronic Signature(s) Signed: 01/28/2023 2:56:40 PM By: Allen Derry PA-C Entered By: Allen Derry on 01/28/2023 14:56:39 Peggyann Juba ([id number]) [id number] Page 3 of 7 -------------------------------------------------------------------------------- Physician Orders Details Patient Name: Date of Service: KAILI, BIEBER 01/28/2023 2:30 PM Medical Record Number: [id number] Patient Account Number: [id number] Date of Birth/Sex: Treating RN: 04-Dec-1940 (82 y.o. Freddy Finner Primary Care Provider: Darnelle Spangle Other Clinician: Referring Provider: Treating Provider/Extender: Paulina Fusi Weeks in Treatment: 11 Verbal / Phone Orders: No Diagnosis Coding ICD-10 Coding Code Description E11.622 Type 2 diabetes mellitus with other skin ulcer I87.331 Chronic venous hypertension (idiopathic) with ulcer and inflammation of right lower extremity L97.812 Non-pressure chronic ulcer of other part of right lower leg with fat layer  exposed I10 Essential (primary) hypertension Follow-up Appointments Return Appointment in 1 week. Bathing/ Shower/ Hygiene May shower with wound dressing protected with  water repellent cover or cast protector. No tub bath. Anesthetic (Use 'Patient Medications' Section for Anesthetic Order Entry) Lidocaine applied to wound bed Edema Control - Lymphedema / Segmental Compressive Device / Other Elevate, Exercise Daily and A void Standing for Long Periods of Time. Elevate legs to the level of the heart and pump ankles as often as possible Elevate leg(s) parallel to the floor when sitting. Wound Treatment Wound #1 - Lower Leg Wound Laterality: Right, Medial Cleanser: Byram Ancillary Kit - 15 Day Supply (Generic) 3 x Per Week/30 Days Discharge Instructions: Use supplies as instructed; Kit contains: (15) Saline Bullets; (15) 3x3 Gauze; 15 pr Gloves Cleanser: Soap and Water 3 x Per Week/30 Days Discharge Instructions: Gently cleanse wound with antibacterial soap, rinse and pat dry prior to dressing wounds Cleanser: Wound Cleanser (Generic) 3 x Per Week/30 Days Discharge Instructions: Wash your hands with soap and water. Remove old dressing, discard into plastic bag and place into trash. Cleanse the wound with Wound Cleanser prior to applying a clean dressing using gauze sponges, not tissues or cotton balls. Do not scrub or use excessive force. Pat dry using gauze sponges, not tissue or cotton balls. Peri-Wound Care: AandD Ointment 3 x Per Week/30 Days Discharge Instructions: Apply AandD Ointment as directed Secondary Dressing: ABD Pad 5x9 (in/in) 3 x Per Week/30 Days Discharge Instructions: Cover with ABD pad Secured With: Tubigrip Size D, 3x10 (in/yd) 3 x Per Week/30 Days Discharge Instructions: double layer Electronic Signature(s) Signed: 01/28/2023 3:39:18 PM By: Yevonne Pax RN Signed: 01/28/2023 4:48:59 PM By: Allen Derry PA-C Entered By: Yevonne Pax on 01/28/2023 14:50:12 Peggyann Juba ([id number]) [id number] Page 4 of 7 -------------------------------------------------------------------------------- Problem List Details Patient Name: Date of Service: [name], [name] 01/28/2023 2:30 PM Medical Record Number: [id number] Patient Account Number: [id number] Date of Birth/Sex: Treating RN: December 27, 1940 (82 y.o. Freddy Finner Primary Care Provider: Darnelle Spangle Other Clinician: Referring Provider: Treating Provider/Extender: Paulina Fusi Weeks in Treatment: 11 Active Problems ICD-10 Encounter Code Description Active Date MDM Diagnosis E11.622 Type 2 diabetes mellitus with other skin ulcer 11/09/2022 No Yes I87.331 Chronic venous hypertension (idiopathic) with ulcer and inflammation of right 11/09/2022 No Yes lower extremity L97.812 Non-pressure chronic ulcer of other part of right lower leg with fat layer 11/09/2022 No Yes exposed I10 Essential (primary) hypertension 11/09/2022 No Yes Inactive Problems Resolved Problems Electronic Signature(s) Signed: 01/28/2023 2:32:59 PM By: Allen Derry PA-C Entered By: Allen Derry on 01/28/2023 14:32:59 -------------------------------------------------------------------------------- Progress Note Details Patient Name: Date of Service: [name], [name] 01/28/2023 2:30 PM Medical Record Number: [id number] Patient Account Number: [id number] Date of Birth/Sex: Treating RN: 01-27-1941 (82 y.o. Freddy Finner Primary Care Provider: Darnelle Spangle Other Clinician: Referring Provider: Treating Provider/Extender: Paulina Fusi [place], [name] ([id number]) [id number] Page 5 of 7 Weeks in Treatment: 11 Subjective Chief Complaint Information obtained from Patient Right LE Ulcer History of Present Illness (HPI) 11-09-2022 upon evaluation today patient appears to be doing somewhat poorly in regard to her wound on her right medial lower extremity.  This occurred as a result of the trauma initially which I think turned into more of an abscess. This does have some tracking underneath she has 3 openings all which are part of the same wound. I am unsure if this is going end up opening up into the entire region or not but time will tell as far as that is concerned. With that being said right now she has been on doxycycline earlier in April she is out  of the mupirocin at this point. With that being said I think she may need to be back on the doxycycline in order to ensure especially after I cleaned this up today that we do not worsen anything from an infection standpoint. Patient does have a history of chronic venous hypertension, hypertension, and diabetes mellitus type 2. Her most recent hemoglobin A1c was 5.9 on October 20, 2022 11-16-2022 upon evaluation today patient appears to be doing better in regard to her wound which is actually slowly started to look improved as far as the overall appearance of the wound bed is concerned. Fortunately I do not see any signs of active infection locally or systemically which is great news and overall I am extremely pleased with where things stand currently. 11-24-2022 upon evaluation today patient appears to be doing well currently in regard to her leg ulcer. She has been making some progress here. Some of the skin is thinned out between the openings and I think we probably need to open this up some of these more confluent area versus the spider openings in multiple areas. 11-30-2022 upon evaluation today patient appears to be doing well currently in regard to her wound she has been tolerating the dressing changes without complication. With that being said I do think that we are making pretty good progress here which is great news. I do not see any evidence of active infection locally nor systemically which is excellent as well. 12-07-2022 upon evaluation today patient actually appears to be making some great  improvements here in regard to her wound. In fact this is showing signs of good granulation and there is really no need for sharp debridement today which is great news as well. 12-14-2022 upon evaluation today patient appears to be doing well currently in regard to her wounds she is actually showing some signs of improvement and I am very pleased in that regard. The undermining areas and tunneling is actually dramatically improved compared to what it was. 12-22-2022 upon evaluation today patient appears to be doing well currently in regard to her wounds which are actually showing signs of significant improvement. I am actually very pleased with where we stand I think she is making great progress. 12-29-2022 upon evaluation today patient appears to be doing well currently in regard to her wound in fact this is showing signs of excellent improvement she seems to be very close to complete resolution. 01-05-2023 upon evaluation today patient appears to be doing excellent in regard to her leg ulcer. She has been tolerating the dressing changes without complication and in general I do believe that we are making excellent headway towards complete closure. This wound is dramatically improved from where she started. 01-14-2023 upon evaluation today patient appears to be doing well currently in regard to her wound. She has been tolerating dressing changes without complication. Fortunately there does not appear to be any signs of active infection locally or systemically at this time which is great news. No fevers, chills, nausea, vomiting, or diarrhea. 7/23; small traumatic wound on the right medial lower leg. This is about half the size of last week's visit. We have been using Prisma and Tubigrip. The patient is a diabetic probably has some degree of chronic venous insufficiency. She does not have a prior history of difficult to heal wounds 01-28-2023 upon evaluation today patient appears to be doing excellent in  regard to her wound area in fact I think this is pretty much completely healed. Fortunately I do not see  any signs of active infection locally or systemically which is great news and in general I do believe that making excellent headway towards complete closure. Objective Constitutional Well-nourished and well-hydrated in no acute distress. Vitals Time Taken: 2:34 PM, Height: 64 in, Weight: 190 lbs, BMI: 32.6, Temperature: 97.9 F, Pulse: 47 bpm, Respiratory Rate: 18 breaths/min, Blood Pressure: 141/55 mmHg. Respiratory normal breathing without difficulty. Psychiatric this patient is able to make decisions and demonstrates good insight into disease process. Alert and Oriented x 3. pleasant and cooperative. General Notes: Upon inspection patient's wound again is pretty much healed and may be just a tiny pinpoint area still draining but this is minimal at this time. I do not see any signs of worsening infection overall I think that she is really doing quite also not at this point. I do think some AandD ointment could be beneficial for her. Integumentary (Hair, Skin) Wound #1 status is Open. Original cause of wound was Trauma. The date acquired was: 10/12/2022. The wound has been in treatment 11 weeks. The wound is located on the Right,Medial Lower Leg. The wound measures 0.1cm length x 0.1cm width x 0.1cm depth; 0.008cm^2 area and 0.001cm^3 volume. There is Fat [name], [name] ([id number]) [id number] Page 6 of 7 Layer (Subcutaneous Tissue) exposed. There is no tunneling or undermining noted. There is a medium amount of serosanguineous drainage noted. There is large (67-100%) granulation within the wound bed. There is no necrotic tissue within the wound bed. Assessment Active Problems ICD-10 Type 2 diabetes mellitus with other skin ulcer Chronic venous hypertension (idiopathic) with ulcer and inflammation of right lower extremity Non-pressure chronic ulcer of  other part of right lower leg with fat layer exposed Essential (primary) hypertension Plan Follow-up Appointments: Return Appointment in 1 week. Bathing/ Shower/ Hygiene: May shower with wound dressing protected with water repellent cover or cast protector. No tub bath. Anesthetic (Use 'Patient Medications' Section for Anesthetic Order Entry): Lidocaine applied to wound bed Edema Control - Lymphedema / Segmental Compressive Device / Other: Elevate, Exercise Daily and Avoid Standing for Long Periods of Time. Elevate legs to the level of the heart and pump ankles as often as possible Elevate leg(s) parallel to the floor when sitting. WOUND #1: - Lower Leg Wound Laterality: Right, Medial Cleanser: Byram Ancillary Kit - 15 Day Supply (Generic) 3 x Per Week/30 Days Discharge Instructions: Use supplies as instructed; Kit contains: (15) Saline Bullets; (15) 3x3 Gauze; 15 pr Gloves Cleanser: Soap and Water 3 x Per Week/30 Days Discharge Instructions: Gently cleanse wound with antibacterial soap, rinse and pat dry prior to dressing wounds Cleanser: Wound Cleanser (Generic) 3 x Per Week/30 Days Discharge Instructions: Wash your hands with soap and water. Remove old dressing, discard into plastic bag and place into trash. Cleanse the wound with Wound Cleanser prior to applying a clean dressing using gauze sponges, not tissues or cotton balls. Do not scrub or use excessive force. Pat dry using gauze sponges, not tissue or cotton balls. Peri-Wound Care: AandD Ointment 3 x Per Week/30 Days Discharge Instructions: Apply AandD Ointment as directed Secondary Dressing: ABD Pad 5x9 (in/in) 3 x Per Week/30 Days Discharge Instructions: Cover with ABD pad Secured With: Tubigrip Size D, 3x10 (in/yd) 3 x Per Week/30 Days Discharge Instructions: double layer 1. I would recommend that we have the patient continue to monitor for any signs of infection or worsening weakness which he is using some AandD ointment  to help with the dry skin over this area followed by ABD pad  and then the Tubigrip I think this should do quite well. 2. I am also can recommend that we have the patient continue with the Tubigrip size D and I think she should do quite well with that as she has done excellent up to this point. We will see patient back for reevaluation in 1 week here in the clinic. If anything worsens or changes patient will contact our office for additional recommendations. Electronic Signature(s) Signed: 01/28/2023 2:57:05 PM By: Allen Derry PA-C Entered By: Allen Derry on 01/28/2023 14:57:05 -------------------------------------------------------------------------------- SuperBill Details Patient Name: Date of Service: [name], [name] 01/28/2023 Medical Record Number: [id number] Patient Account Number: [id number] [name], [name] ([id number]) [id number] Page 7 of 7 Date of Birth/Sex: Treating RN: [birth year] (82 y.o. Freddy Finner Primary Care Provider: Darnelle Spangle Other Clinician: Referring Provider: Treating Provider/Extender: Paulina Fusi Weeks in Treatment: 11 Diagnosis Coding ICD-10 Codes Code Description E11.622 Type 2 diabetes mellitus with other skin ulcer I87.331 Chronic venous hypertension (idiopathic) with ulcer and inflammation of right lower extremity L97.812 Non-pressure chronic ulcer of other part of right lower leg with fat layer exposed I10 Essential (primary) hypertension Facility Procedures : CPT4 Code: [id number] Description: [id number] - WOUND CARE VISIT-LEV 2 EST PT Modifier: Quantity: 1 Physician Procedures : CPT4 Code Description Modifier [id number] - WC PHYS LEVEL 3 - EST PT ICD-10 Diagnosis Description E11.622 Type 2 diabetes mellitus with other skin ulcer I87.331 Chronic venous hypertension (idiopathic) with ulcer and inflammation of right lower  extremity L97.812 Non-pressure chronic ulcer of other part of right lower leg  with fat layer exposed I10 Essential (primary) hypertension Quantity: 1 Electronic Signature(s) Signed: 01/28/2023 3:47:32 PM By: Yevonne Pax RN Signed: 01/28/2023 4:48:59 PM By: Allen Derry PA-C Previous Signature: 01/28/2023 2:57:19 PM Version By: Allen Derry PA-C Entered By: Yevonne Pax on 01/28/2023 15:47:31

## 2023-01-28 NOTE — Progress Notes (Addendum)
[name] ([id number]) [id number] Page 1 of 9 Visit Report for 01/28/2023 Arrival Information Details Patient Name: Date of Service: [name] 01/28/2023 2:30 PM Medical Record Number: [id number] Patient Account Number: [id number] Date of Birth/Sex: Treating RN: 1940-11-11 (82 y.o. Freddy Finner Primary Care Inga Noller: Darnelle Spangle Other Clinician: Referring Kiela Shisler: Treating Debroah Shuttleworth/Extender: Paulina Fusi Weeks in Treatment: 11 Visit Information History Since Last Visit Added or deleted any medications: No Patient Arrived: Ambulatory Any new allergies or adverse reactions: No Arrival Time: 14:32 Had a fall or experienced change in No Accompanied By: husband activities of daily living that may affect Transfer Assistance: None risk of falls: Patient Identification Verified: Yes Signs or symptoms of abuse/neglect since last visito No Secondary Verification Process Completed: Yes Hospitalized since last visit: No Patient Requires Transmission-Based Precautions: No Implantable device outside of the clinic excluding No Patient Has Alerts: No cellular tissue based products placed in the center since last visit: Has Dressing in Place as Prescribed: Yes Pain Present Now: No Electronic Signature(s) Signed: 01/28/2023 3:39:18 PM By: Yevonne Pax RN Entered By: Yevonne Pax on 01/28/2023 14:34:09 -------------------------------------------------------------------------------- Clinic Level of Care Assessment Details Patient Name: Date of ServiceAMAIA, [name] 01/28/2023 2:30 PM Medical Record Number: [id number] Patient Account Number: [id number] Date of Birth/Sex: Treating RN: [birth year] (82 y.o. Freddy Finner Primary Care Daytona Hedman: Darnelle Spangle Other Clinician: Referring Vikram Tillett: Treating Florena Kozma/Extender: Paulina Fusi Weeks in Treatment: 11 Clinic Level of Care Assessment Items TOOL 4 Quantity  Score X- 1 0 Use when only an EandM is performed on FOLLOW-UP visit ASSESSMENTS - Nursing Assessment / Reassessment X- 1 10 Reassessment of Co-morbidities (includes updates in patient status) X- 1 5 Reassessment of Adherence to Treatment Plan [name], [name] ([id number]) [phone].pdf Page 2 of 9 ASSESSMENTS - Wound and Skin A ssessment / Reassessment X - Simple Wound Assessment / Reassessment - one wound 1 5 []  - 0 Complex Wound Assessment / Reassessment - multiple wounds []  - 0 Dermatologic / Skin Assessment (not related to wound area) ASSESSMENTS - Focused Assessment []  - 0 Circumferential Edema Measurements - multi extremities []  - 0 Nutritional Assessment / Counseling / Intervention []  - 0 Lower Extremity Assessment (monofilament, tuning fork, pulses) []  - 0 Peripheral Arterial Disease Assessment (using hand held doppler) ASSESSMENTS - Ostomy and/or Continence Assessment and Care []  - 0 Incontinence Assessment and Management []  - 0 Ostomy Care Assessment and Management (repouching, etc.) PROCESS - Coordination of Care X - Simple Patient / Family Education for ongoing care 1 15 []  - 0 Complex (extensive) Patient / Family Education for ongoing care []  - 0 Staff obtains Chiropractor, Records, T Results / Process Orders est []  - 0 Staff telephones HHA, Nursing Homes / Clarify orders / etc []  - 0 Routine Transfer to another Facility (non-emergent condition) []  - 0 Routine Hospital Admission (non-emergent condition) []  - 0 New Admissions / Manufacturing engineer / Ordering NPWT Apligraf, etc. , []  - 0 Emergency Hospital Admission (emergent condition) X- 1 10 Simple Discharge Coordination []  - 0 Complex (extensive) Discharge Coordination PROCESS - Special Needs []  - 0 Pediatric / Minor Patient Management []  - 0 Isolation Patient Management []  - 0 Hearing / Language / Visual special needs []  - 0 Assessment of Community assistance  (transportation, D/C planning, etc.) []  - 0 Additional assistance / Altered mentation []  - 0 Support Surface(s) Assessment (bed, cushion, seat, etc.) INTERVENTIONS - Wound Cleansing / Measurement X - Simple Wound Cleansing - one wound 1 5 []  -  0 Complex Wound Cleansing - multiple wounds X- 1 5 Wound Imaging (photographs - any number of wounds) []  - 0 Wound Tracing (instead of photographs) X- 1 5 Simple Wound Measurement - one wound []  - 0 Complex Wound Measurement - multiple wounds INTERVENTIONS - Wound Dressings X - Small Wound Dressing one or multiple wounds 1 10 []  - 0 Medium Wound Dressing one or multiple wounds []  - 0 Large Wound Dressing one or multiple wounds []  - 0 Application of Medications - topical []  - 0 Application of Medications - injection INTERVENTIONS - Miscellaneous []  - 0 External ear exam [name], [name] ([id number]) [id number] Page 3 of 9 []  - 0 Specimen Collection (cultures, biopsies, blood, body fluids, etc.) []  - 0 Specimen(s) / Culture(s) sent or taken to Lab for analysis []  - 0 Patient Transfer (multiple staff / Michiel Sites Lift / Similar devices) []  - 0 Simple Staple / Suture removal (25 or less) []  - 0 Complex Staple / Suture removal (26 or more) []  - 0 Hypo / Hyperglycemic Management (close monitor of Blood Glucose) []  - 0 Ankle / Brachial Index (ABI) - do not check if billed separately X- 1 5 Vital Signs Has the patient been seen at the hospital within the last three years: Yes Total Score: 75 Level Of Care: New/Established - Level 2 Electronic Signature(s) Signed: 01/28/2023 3:39:18 PM By: Yevonne Pax RN Entered By: Yevonne Pax on 01/28/2023 15:37:56 -------------------------------------------------------------------------------- Encounter Discharge Information Details Patient Name: Date of Service: [name], [name] 01/28/2023 2:30 PM Medical Record Number: [id number] Patient Account Number:  [id number] Date of Birth/Sex: Treating RN: 1941/03/19 (82 y.o. Freddy Finner Primary Care Darral Rishel: Darnelle Spangle Other Clinician: Referring Tonica Brasington: Treating Treysean Petruzzi/Extender: Paulina Fusi Weeks in Treatment: 11 Encounter Discharge Information Items Discharge Condition: Stable Ambulatory Status: Ambulatory Discharge Destination: Home Transportation: Private Auto Accompanied By: husband Schedule Follow-up Appointment: Yes Clinical Summary of Care: Electronic Signature(s) Signed: 01/28/2023 3:05:12 PM By: Yevonne Pax RN Entered By: Yevonne Pax on 01/28/2023 15:05:12 -------------------------------------------------------------------------------- Lower Extremity Assessment Details Patient Name: Date of Service: [name], [name] 01/28/2023 2:30 PM Peggyann Juba ([id number]) [id number] Page 4 of 9 Medical Record Number: [id number] Patient Account Number: [id number] Date of Birth/Sex: Treating RN: Apr 22, 1941 (82 y.o. Freddy Finner Primary Care Carlito Bogert: Darnelle Spangle Other Clinician: Referring Micaiah Litle: Treating Olisa Quesnel/Extender: Paulina Fusi Weeks in Treatment: 11 Edema Assessment Assessed: [Left: No] [Right: No] Edema: [Left: N] [Right: o] Calf Left: Right: Point of Measurement: 36 cm From Medial Instep 36 cm Ankle Left: Right: Point of Measurement: 11 cm From Medial Instep 23 cm Vascular Assessment Pulses: Dorsalis Pedis Palpable: [Right:Yes] Extremity colors, hair growth, and conditions: Extremity Color: [Right:Normal] Hair Growth on Extremity: [Right:No] Temperature of Extremity: [Right:Warm] Capillary Refill: [Right:< 3 seconds] Dependent Rubor: [Right:No] Blanched when Elevated: [Right:No No] Toe Nail Assessment Left: Right: Thick: No Discolored: No Deformed: No Improper Length and Hygiene: No Electronic Signature(s) Signed: 01/28/2023 3:39:18 PM By: Yevonne Pax RN Entered By: Yevonne Pax on  01/28/2023 14:42:08 -------------------------------------------------------------------------------- Multi Wound Chart Details Patient Name: Date of Service: [name], [name] 01/28/2023 2:30 PM Medical Record Number: [id number] Patient Account Number: [id number] Date of Birth/Sex: Treating RN: 1940/10/22 (82 y.o. Freddy Finner Primary Care Stephene Alegria: Darnelle Spangle Other Clinician: Referring Talya Quain: Treating Jule Whitsel/Extender: Paulina Fusi Weeks in Treatment: 11 Vital Signs Height(in): 64 Pulse(bpm): 47 Weight(lbs): 190 Blood Pressure(mmHg): 141/55 Body Mass Index(BMI): 32.6 Temperature(F): 97.9 Respiratory Rate(breaths/min): 18 [name], [name] ([id number]) [id number] Page 5 of 9 [1:Photos:] [N/A:N/A] Right,  Medial Lower Leg N/A N/A Wound Location: Trauma N/A N/A Wounding Event: Diabetic Wound/Ulcer of the Lower N/A N/A Primary Etiology: Extremity Lymphedema, Hypertension, Type II N/A N/A Comorbid History: Diabetes 10/12/2022 N/A N/A Date Acquired: 11 N/A N/A Weeks of Treatment: Open N/A N/A Wound Status: No N/A N/A Wound Recurrence: 0.1x0.1x0.1 N/A N/A Measurements L x W x D (cm) 0.008 N/A N/A A (cm) : rea 0.001 N/A N/A Volume (cm) : 99.90% N/A N/A % Reduction in A rea: 100.00% N/A N/A % Reduction in Volume: Grade 1 N/A N/A Classification: Medium N/A N/A Exudate A mount: Serosanguineous N/A N/A Exudate Type: red, brown N/A N/A Exudate Color: Large (67-100%) N/A N/A Granulation A mount: None Present (0%) N/A N/A Necrotic A mount: Fat Layer (Subcutaneous Tissue): Yes N/A N/A Exposed Structures: Fascia: No Tendon: No Muscle: No Joint: No Bone: No None N/A N/A Epithelialization: Treatment Notes Electronic Signature(s) Signed: 01/28/2023 3:39:18 PM By: Yevonne Pax RN Entered By: Yevonne Pax on 01/28/2023  14:42:12 -------------------------------------------------------------------------------- Multi-Disciplinary Care Plan Details Patient Name: Date of Service: [name], [name] 01/28/2023 2:30 PM Medical Record Number: [id number] Patient Account Number: [id number] Date of Birth/Sex: Treating RN: [birth year] (82 y.o. Freddy Finner Primary Care Aizlyn Schifano: Darnelle Spangle Other Clinician: Referring Cyriah Childrey: Treating Marten Iles/Extender: Paulina Fusi Weeks in Treatment: 11 Active Inactive Wound/Skin Impairment Nursing Diagnoses: [name], [name] ([id number]) [id number] Page 6 of 9 Knowledge deficit related to ulceration/compromised skin integrity Goals: Patient/caregiver will verbalize understanding of skin care regimen Date Initiated: 11/09/2022 Target Resolution Date: 02/09/2023 Goal Status: Active Ulcer/skin breakdown will have a volume reduction of 30% by week 4 Date Initiated: 11/09/2022 Date Inactivated: 12/14/2022 Target Resolution Date: 12/10/2022 Goal Status: Unmet Unmet Reason: comorbidities Ulcer/skin breakdown will have a volume reduction of 50% by week 8 Date Initiated: 11/09/2022 Date Inactivated: 01/14/2023 Target Resolution Date: 01/09/2023 Goal Status: Met Ulcer/skin breakdown will have a volume reduction of 80% by week 12 Date Initiated: 11/09/2022 Target Resolution Date: 02/09/2023 Goal Status: Active Ulcer/skin breakdown will heal within 14 weeks Date Initiated: 11/09/2022 Target Resolution Date: 03/12/2023 Goal Status: Active Interventions: Assess patient/caregiver ability to obtain necessary supplies Assess patient/caregiver ability to perform ulcer/skin care regimen upon admission and as needed Assess ulceration(s) every visit Notes: Electronic Signature(s) Signed: 01/28/2023 3:39:18 PM By: Yevonne Pax RN Entered By: Yevonne Pax on 01/28/2023  14:42:27 -------------------------------------------------------------------------------- Pain Assessment Details Patient Name: Date of ServiceMIAKODA, [name] 01/28/2023 2:30 PM Medical Record Number: [id number] Patient Account Number: [id number] Date of Birth/Sex: Treating RN: 1941-02-27 (82 y.o. Freddy Finner Primary Care Johncarlo Maalouf: Darnelle Spangle Other Clinician: Referring Kellina Dreese: Treating Courtenay Creger/Extender: Paulina Fusi Weeks in Treatment: 11 Active Problems Location of Pain Severity and Description of Pain Patient Has Paino No Site Locations [place], [name] ([id number]) [phone].pdf Page 7 of 9 Pain Management and Medication Current Pain Management: Electronic Signature(s) Signed: 01/28/2023 3:39:18 PM By: Yevonne Pax RN Entered By: Yevonne Pax on 01/28/2023 14:35:04 -------------------------------------------------------------------------------- Patient/Caregiver Education Details Patient Name: Date of Service: [name] 8/1/2024andnbsp2:30 PM Medical Record Number: [id number] Patient Account Number: [id number] Date of Birth/Gender: Treating RN: 12-Oct-1940 (82 y.o. Freddy Finner Primary Care Physician: Darnelle Spangle Other Clinician: Referring Physician: Treating Physician/Extender: Paulina Fusi Weeks in Treatment: 11 Education Assessment Education Provided To: Patient Education Topics Provided Wound/Skin Impairment: Handouts: Caring for Your Ulcer Methods: Explain/Verbal Responses: State content correctly Electronic Signature(s) Signed: 01/28/2023 3:39:18 PM By: Yevonne Pax RN Entered By: Yevonne Pax on 01/28/2023 14:42:40 -------------------------------------------------------------------------------- Wound Assessment Details Patient Name: Date of  ServiceRHYDER, [name] 01/28/2023 2:30 PM Medical Record Number: [id number] Patient Account Number: [id number] Date of Birth/Sex: Treating  RN: [birth year] (83 y.o. Freddy Finner Primary Care Zanaya Baize: Darnelle Spangle Other Clinician: Referring Tayona Sarnowski: Treating Ramelo Oetken/Extender: Paulina Fusi Weeks in Treatment: 11 Wound Status Wound Number: 1 Primary Etiology: Diabetic Wound/Ulcer of the Lower Extremity [name], [name] ([id number]) [phone].pdf Page 8 of 9 Wound Location: Right, Medial Lower Leg Wound Status: Open Wounding Event: Trauma Comorbid History: Lymphedema, Hypertension, Type II Diabetes Date Acquired: 10/12/2022 Weeks Of Treatment: 11 Clustered Wound: No Photos Wound Measurements Length: (cm) 0.1 Width: (cm) 0.1 Depth: (cm) 0.1 Area: (cm) 0.008 Volume: (cm) 0.001 % Reduction in Area: 99.9% % Reduction in Volume: 100% Epithelialization: None Tunneling: No Undermining: No Wound Description Classification: Grade 1 Exudate Amount: Medium Exudate Type: Serosanguineous Exudate Color: red, brown Foul Odor After Cleansing: No Slough/Fibrino No Wound Bed Granulation Amount: Large (67-100%) Exposed Structure Necrotic Amount: None Present (0%) Fascia Exposed: No Fat Layer (Subcutaneous Tissue) Exposed: Yes Tendon Exposed: No Muscle Exposed: No Joint Exposed: No Bone Exposed: No Treatment Notes Wound #1 (Lower Leg) Wound Laterality: Right, Medial Cleanser [place]ram Ancillary Kit - 15 Day Supply Discharge Instruction: Use supplies as instructed; Kit contains: (15) Saline Bullets; (15) 3x3 Gauze; 15 pr Gloves Soap and Water Discharge Instruction: Gently cleanse wound with antibacterial soap, rinse and pat dry prior to dressing wounds Wound Cleanser Discharge Instruction: Wash your hands with soap and water. Remove old dressing, discard into plastic bag and place into trash. Cleanse the wound with Wound Cleanser prior to applying a clean dressing using gauze sponges, not tissues or cotton balls. Do not scrub or use excessive force. Pat dry using gauze sponges, not  tissue or cotton balls. Peri-Wound Care AandD Ointment Discharge Instruction: Apply AandD Ointment as directed Topical Primary Dressing Secondary Dressing ABD Pad 5x9 (in/in) Discharge Instruction: Cover with ABD pad Secured With Tubigrip Size D, 3x10 (in/yd) Discharge Instruction: double layer Compression Telma Christ, Angelyna ([id number]) [phone].pdf Page 9 of 9 Compression Stockings Add-Ons Electronic Signature(s) Signed: 01/28/2023 3:39:18 PM By: Yevonne Pax RN Entered By: Yevonne Pax on 01/28/2023 14:41:14 -------------------------------------------------------------------------------- Vitals Details Patient Name: Date of Service: [name], Damyah 01/28/2023 2:30 PM Medical Record Number: [id number] Patient Account Number: [id number] Date of Birth/Sex: Treating RN: [birth year] (82 y.o. Freddy Finner Primary Care Anthonie Lotito: Darnelle Spangle Other Clinician: Referring Auron Tadros: Treating Aryonna Gunnerson/Extender: Paulina Fusi Weeks in Treatment: 11 Vital Signs Time Taken: 14:34 Temperature (F): 97.9 Height (in): 64 Pulse (bpm): 47 Weight (lbs): 190 Respiratory Rate (breaths/min): 18 Body Mass Index (BMI): 32.6 Blood Pressure (mmHg): 141/55 Reference Range: 80 - 120 mg / dl Electronic Signature(s) Signed: 01/28/2023 3:39:18 PM By: Yevonne Pax RN Entered By: Yevonne Pax on 01/28/2023 14:34:51

## 2023-02-04 ENCOUNTER — Encounter: Payer: Medicare PPO | Admitting: Physician Assistant

## 2023-02-04 DIAGNOSIS — E11622 Type 2 diabetes mellitus with other skin ulcer: Secondary | ICD-10-CM | POA: Diagnosis not present

## 2023-02-04 NOTE — Progress Notes (Addendum)
JANUS, RAAD ([id number]) [id number] Page 1 of 6 Visit Report for 02/04/2023 Chief Complaint Document Details Patient Name: Date of Service: [name], [name] 02/04/2023 12:30 PM Medical Record Number: [id number] Patient Account Number: [id number] Date of Birth/Sex: Treating RN: 1940/09/12 (82 y.o. Freddy Finner Primary Care Provider: Darnelle Spangle Other Clinician: Referring Provider: Treating Provider/Extender: Paulina Fusi Weeks in Treatment: 12 Information Obtained from: Patient Chief Complaint Right LE Ulcer Electronic Signature(s) Signed: 02/04/2023 1:00:49 PM By: Allen Derry PA-C Entered By: Allen Derry on 02/04/2023 13:00:48 -------------------------------------------------------------------------------- HPI Details Patient Name: Date of Service: [name], [name] 02/04/2023 12:30 PM Medical Record Number: [id number] Patient Account Number: [id number] Date of Birth/Sex: Treating RN: February 02, 1941 (82 y.o. Freddy Finner Primary Care Provider: Darnelle Spangle Other Clinician: Referring Provider: Treating Provider/Extender: Paulina Fusi Weeks in Treatment: 12 History of Present Illness HPI Description: 11-09-2022 upon evaluation today patient appears to be doing somewhat poorly in regard to her wound on her right medial lower extremity. This occurred as a result of the trauma initially which I think turned into more of an abscess. This does have some tracking underneath she has 3 openings all which are part of the same wound. I am unsure if this is going end up opening up into the entire region or not but time will tell as far as that is concerned. With that being said right now she has been on doxycycline earlier in April she is out of the mupirocin at this point. With that being said I think she may need to be back on the doxycycline in order to ensure especially after I cleaned this up today that we do not worsen anything  from an infection standpoint. Patient does have a history of chronic venous hypertension, hypertension, and diabetes mellitus type 2. Her most recent hemoglobin A1c was 5.9 on October 20, 2022 11-16-2022 upon evaluation today patient appears to be doing better in regard to her wound which is actually slowly started to look improved as far as the overall appearance of the wound bed is concerned. Fortunately I do not see any signs of active infection locally or systemically which is great news and overall I am extremely pleased with where things stand currently. 11-24-2022 upon evaluation today patient appears to be doing well currently in regard to her leg ulcer. She has been making some progress here. Some of the skin is thinned out between the openings and I think we probably need to open this up some of these more confluent area versus the spider openings in multiple areas. PONDA, WINDISH ([id number]) [id number] Page 2 of 6 11-30-2022 upon evaluation today patient appears to be doing well currently in regard to her wound she has been tolerating the dressing changes without complication. With that being said I do think that we are making pretty good progress here which is great news. I do not see any evidence of active infection locally nor systemically which is excellent as well. 12-07-2022 upon evaluation today patient actually appears to be making some great improvements here in regard to her wound. In fact this is showing signs of good granulation and there is really no need for sharp debridement today which is great news as well. 12-14-2022 upon evaluation today patient appears to be doing well currently in regard to her wounds she is actually showing some signs of improvement and I am very pleased in that regard. The undermining areas and tunneling is actually dramatically improved compared to what it was. 12-22-2022  upon evaluation today patient appears to be doing  well currently in regard to her wounds which are actually showing signs of significant improvement. I am actually very pleased with where we stand I think she is making great progress. 12-29-2022 upon evaluation today patient appears to be doing well currently in regard to her wound in fact this is showing signs of excellent improvement she seems to be very close to complete resolution. 01-05-2023 upon evaluation today patient appears to be doing excellent in regard to her leg ulcer. She has been tolerating the dressing changes without complication and in general I do believe that we are making excellent headway towards complete closure. This wound is dramatically improved from where she started. 01-14-2023 upon evaluation today patient appears to be doing well currently in regard to her wound. She has been tolerating dressing changes without complication. Fortunately there does not appear to be any signs of active infection locally or systemically at this time which is great news. No fevers, chills, nausea, vomiting, or diarrhea. 7/23; small traumatic wound on the right medial lower leg. This is about half the size of last week's visit. We have been using Prisma and Tubigrip. The patient is a diabetic probably has some degree of chronic venous insufficiency. She does not have a prior history of difficult to heal wounds 01-28-2023 upon evaluation today patient appears to be doing excellent in regard to her wound area in fact I think this is pretty much completely healed. Fortunately I do not see any signs of active infection locally or systemically which is great news and in general I do believe that making excellent headway towards complete closure. 02-04-2023 upon evaluation today patient appears to be doing well currently in regard to her wound she is actually showing signs of excellent improvement and the good news is I do not see any signs of active infection at this time. No fevers, chills, nausea,  vomiting, or diarrhea. Electronic Signature(s) Signed: 02/04/2023 1:17:28 PM By: Allen Derry PA-C Entered By: Allen Derry on 02/04/2023 13:17:28 -------------------------------------------------------------------------------- Physical Exam Details Patient Name: Date of Service: [name], [name] 02/04/2023 12:30 PM Medical Record Number: [id number] Patient Account Number: [id number] Date of Birth/Sex: Treating RN: July 12, 1940 (82 y.o. Freddy Finner Primary Care Provider: Darnelle Spangle Other Clinician: Referring Provider: Treating Provider/Extender: Paulina Fusi Weeks in Treatment: 12 Constitutional Well-nourished and well-hydrated in no acute distress. Respiratory normal breathing without difficulty. Psychiatric this patient is able to make decisions and demonstrates good insight into disease process. Alert and Oriented x 3. pleasant and cooperative. Notes Upon inspection patient's wound bed showed signs of good granulation and epithelization at this point. Fortunately I do not see any signs of worsening overall and I think that the patient is in fact completely healed based on the visual appearance of the wound today. I am extremely pleased. Electronic Signature(s) Signed: 02/04/2023 1:17:50 PM By: Allen Derry PA-C Entered By: Allen Derry on 02/04/2023 13:17:49 [name] ([id number]) [id number] Page 3 of 6 -------------------------------------------------------------------------------- Physician Orders Details Patient Name: Date of Service: [name], [name] 02/04/2023 12:30 PM Medical Record Number: [id number] Patient Account Number: [id number] Date of Birth/Sex: Treating RN: Dec 18, 1940 (82 y.o. Freddy Finner Primary Care Provider: Darnelle Spangle Other Clinician: Referring Provider: Treating Provider/Extender: Paulina Fusi Weeks in Treatment: 12 Verbal / Phone Orders: No Diagnosis Coding ICD-10 Coding Code  Description E11.622 Type 2 diabetes mellitus with other skin ulcer I87.331 Chronic venous hypertension (idiopathic) with ulcer and inflammation of right lower  extremity L97.812 Non-pressure chronic ulcer of other part of right lower leg with fat layer exposed I10 Essential (primary) hypertension Discharge From [facility] Discharge from [facility] Treatment Complete - apply AandD ointment daily times 1 week Electronic Signature(s) Signed: 02/04/2023 1:24:23 PM By: Yevonne Pax RN Signed: 02/04/2023 5:05:18 PM By: Allen Derry PA-C Entered By: Yevonne Pax on 02/04/2023 13:24:22 -------------------------------------------------------------------------------- Problem List Details Patient Name: Date of Service: [name], [name] 02/04/2023 12:30 PM Medical Record Number: [id number] Patient Account Number: [id number] Date of Birth/Sex: Treating RN: 1940-10-16 (82 y.o. Freddy Finner Primary Care Provider: Darnelle Spangle Other Clinician: Referring Provider: Treating Provider/Extender: Paulina Fusi Weeks in Treatment: 12 Active Problems ICD-10 Encounter Code Description Active Date MDM Diagnosis E11.622 Type 2 diabetes mellitus with other skin ulcer 11/09/2022 No Yes I87.331 Chronic venous hypertension (idiopathic) with ulcer and inflammation of right 11/09/2022 No Yes lower extremity ATENEA, BOHMER ([id number]) [id number] Page 4 of 6 [phone] Non-pressure chronic ulcer of other part of right lower leg with fat layer 11/09/2022 No Yes exposed I10 Essential (primary) hypertension 11/09/2022 No Yes Inactive Problems Resolved Problems Electronic Signature(s) Signed: 02/04/2023 1:00:44 PM By: Allen Derry PA-C Entered By: Allen Derry on 02/04/2023 13:00:44 -------------------------------------------------------------------------------- Progress Note Details Patient Name: Date of Service: [name], [name] 02/04/2023 12:30 PM Medical Record  Number: [id number] Patient Account Number: [id number] Date of Birth/Sex: Treating RN: 1941/04/14 (82 y.o. Freddy Finner Primary Care Provider: Darnelle Spangle Other Clinician: Referring Provider: Treating Provider/Extender: Paulina Fusi Weeks in Treatment: 12 Subjective Chief Complaint Information obtained from Patient Right LE Ulcer History of Present Illness (HPI) 11-09-2022 upon evaluation today patient appears to be doing somewhat poorly in regard to her wound on her right medial lower extremity. This occurred as a result of the trauma initially which I think turned into more of an abscess. This does have some tracking underneath she has 3 openings all which are part of the same wound. I am unsure if this is going end up opening up into the entire region or not but time will tell as far as that is concerned. With that being said right now she has been on doxycycline earlier in April she is out of the mupirocin at this point. With that being said I think she may need to be back on the doxycycline in order to ensure especially after I cleaned this up today that we do not worsen anything from an infection standpoint. Patient does have a history of chronic venous hypertension, hypertension, and diabetes mellitus type 2. Her most recent hemoglobin A1c was 5.9 on October 20, 2022 11-16-2022 upon evaluation today patient appears to be doing better in regard to her wound which is actually slowly started to look improved as far as the overall appearance of the wound bed is concerned. Fortunately I do not see any signs of active infection locally or systemically which is great news and overall I am extremely pleased with where things stand currently. 11-24-2022 upon evaluation today patient appears to be doing well currently in regard to her leg ulcer. She has been making some progress here. Some of the skin is thinned out between the openings and I think we probably need to open this up some  of these more confluent area versus the spider openings in multiple areas. 11-30-2022 upon evaluation today patient appears to be doing well currently in regard to her wound she has been tolerating the dressing changes without complication. With that being said I do  think that we are making pretty good progress here which is great news. I do not see any evidence of active infection locally nor systemically which is excellent as well. 12-07-2022 upon evaluation today patient actually appears to be making some great improvements here in regard to her wound. In fact this is showing signs of good granulation and there is really no need for sharp debridement today which is great news as well. 12-14-2022 upon evaluation today patient appears to be doing well currently in regard to her wounds she is actually showing some signs of improvement and I am very pleased in that regard. The undermining areas and tunneling is actually dramatically improved compared to what it was. 12-22-2022 upon evaluation today patient appears to be doing well currently in regard to her wounds which are actually showing signs of significant improvement. I am actually very pleased with where we stand I think she is making great progress. [name], [name] ([id number]) [id number] Page 5 of 6 12-29-2022 upon evaluation today patient appears to be doing well currently in regard to her wound in fact this is showing signs of excellent improvement she seems to be very close to complete resolution. 01-05-2023 upon evaluation today patient appears to be doing excellent in regard to her leg ulcer. She has been tolerating the dressing changes without complication and in general I do believe that we are making excellent headway towards complete closure. This wound is dramatically improved from where she started. 01-14-2023 upon evaluation today patient appears to be doing well currently in regard to her wound. She has  been tolerating dressing changes without complication. Fortunately there does not appear to be any signs of active infection locally or systemically at this time which is great news. No fevers, chills, nausea, vomiting, or diarrhea. 7/23; small traumatic wound on the right medial lower leg. This is about half the size of last week's visit. We have been using Prisma and Tubigrip. The patient is a diabetic probably has some degree of chronic venous insufficiency. She does not have a prior history of difficult to heal wounds 01-28-2023 upon evaluation today patient appears to be doing excellent in regard to her wound area in fact I think this is pretty much completely healed. Fortunately I do not see any signs of active infection locally or systemically which is great news and in general I do believe that making excellent headway towards complete closure. 02-04-2023 upon evaluation today patient appears to be doing well currently in regard to her wound she is actually showing signs of excellent improvement and the good news is I do not see any signs of active infection at this time. No fevers, chills, nausea, vomiting, or diarrhea. Objective Constitutional Well-nourished and well-hydrated in no acute distress. Vitals Time Taken: 12:38 PM, Height: 64 in, Weight: 190 lbs, BMI: 32.6, Temperature: 97.7 F, Pulse: 59 bpm, Respiratory Rate: 18 breaths/min, Blood Pressure: 123/61 mmHg. Respiratory normal breathing without difficulty. Psychiatric this patient is able to make decisions and demonstrates good insight into disease process. Alert and Oriented x 3. pleasant and cooperative. General Notes: Upon inspection patient's wound bed showed signs of good granulation and epithelization at this point. Fortunately I do not see any signs of worsening overall and I think that the patient is in fact completely healed based on the visual appearance of the wound today. I am extremely pleased. Assessment Active  Problems ICD-10 Type 2 diabetes mellitus with other skin ulcer Chronic venous hypertension (idiopathic) with ulcer and inflammation of right lower  extremity Non-pressure chronic ulcer of other part of right lower leg with fat layer exposed Essential (primary) hypertension Plan 1. I would recommend that we going discontinue wound care services appears to be completely healed this is great news. 2. I am going to can recommend as that the patient should continue to monitor for any signs of infection or worsening. I do think that if she has any changes she should contact the office let me know as quickly as possible. Will see her back for follow-up visit as needed. Electronic Signature(s) Signed: 02/04/2023 1:18:22 PM By: Allen Derry PA-C Entered By: Allen Derry on 02/04/2023 13:18:22 [name] ([id number]) [id number] Page 6 of 6 -------------------------------------------------------------------------------- SuperBill Details Patient Name: Date of Service: [name], [name] 02/04/2023 Medical Record Number: [id number] Patient Account Number: [id number] Date of Birth/Sex: Treating RN: [birth year] (82 y.o. Freddy Finner Primary Care Provider: Darnelle Spangle Other Clinician: Referring Provider: Treating Provider/Extender: Paulina Fusi Weeks in Treatment: 12 Diagnosis Coding ICD-10 Codes Code Description E11.622 Type 2 diabetes mellitus with other skin ulcer I87.331 Chronic venous hypertension (idiopathic) with ulcer and inflammation of right lower extremity L97.812 Non-pressure chronic ulcer of other part of right lower leg with fat layer exposed I10 Essential (primary) hypertension Facility Procedures : CPT4 Code: [id number] Description: [phone] - WOUND CARE VISIT-LEV 2 EST PT Modifier: Quantity: 1 Physician Procedures : CPT4 Code Description Modifier [id number] - WC PHYS LEVEL 3 - EST PT ICD-10 Diagnosis Description E11.622 Type 2  diabetes mellitus with other skin ulcer I87.331 Chronic venous hypertension (idiopathic) with ulcer and inflammation of right lower  extremity L97.812 Non-pressure chronic ulcer of other part of right lower leg with fat layer exposed I10 Essential (primary) hypertension Quantity: 1 Electronic Signature(s) Signed: 02/04/2023 1:25:03 PM By: Yevonne Pax RN Signed: 02/04/2023 5:05:18 PM By: Allen Derry PA-C Previous Signature: 02/04/2023 1:18:39 PM Version By: Allen Derry PA-C Entered By: Yevonne Pax on 02/04/2023 13:25:02

## 2023-02-11 NOTE — Progress Notes (Signed)
[name], [name] ([id number]) [id number] Page 1 of 8 Visit Report for 02/04/2023 Arrival Information Details Patient Name: Date of Service: [name], [name] 02/04/2023 12:30 PM Medical Record Number: [id number] Patient Account Number: [id number] Date of Birth/Sex: Treating RN: 1940/11/28 (82 y.o. Freddy Finner Primary Care Sebastin Perlmutter: Darnelle Spangle Other Clinician: Referring Jazlynn Nemetz: Treating Sayyid Harewood/Extender: Paulina Fusi Weeks in Treatment: 12 Visit Information History Since Last Visit Added or deleted any medications: No Patient Arrived: Ambulatory Any new allergies or adverse reactions: No Arrival Time: 12:37 Had a fall or experienced change in No Accompanied By: husband activities of daily living that may affect Transfer Assistance: None risk of falls: Patient Identification Verified: Yes Signs or symptoms of abuse/neglect since last visito No Secondary Verification Process Completed: Yes Hospitalized since last visit: No Patient Requires Transmission-Based Precautions: No Implantable device outside of the clinic excluding No Patient Has Alerts: No cellular tissue based products placed in the center since last visit: Has Dressing in Place as Prescribed: Yes Has Compression in Place as Prescribed: Yes Pain Present Now: No Electronic Signature(s) Signed: 02/11/2023 8:04:04 AM By: Yevonne Pax RN Entered By: Yevonne Pax on 02/04/2023 12:38:03 -------------------------------------------------------------------------------- Clinic Level of Care Assessment Details Patient Name: Date of ServiceKEIYA, [name] 02/04/2023 12:30 PM Medical Record Number: [id number] Patient Account Number: [id number] Date of Birth/Sex: Treating RN: 1941-03-07 (82 y.o. Freddy Finner Primary Care Yohann Curl: Darnelle Spangle Other Clinician: Referring Valentino Saavedra: Treating Braelynn Benning/Extender: Paulina Fusi Weeks in Treatment: 12 Clinic Level  of Care Assessment Items TOOL 4 Quantity Score X- 1 0 Use when only an EandM is performed on FOLLOW-UP visit ASSESSMENTS - Nursing Assessment / Reassessment X- 1 10 Reassessment of Co-morbidities (includes updates in patient status) X- 1 5 Reassessment of Adherence to Treatment Plan PAIZLI, WESTERLUND ([id number]) [phone] Page 2 of 8 ASSESSMENTS - Wound and Skin A ssessment / Reassessment X - Simple Wound Assessment / Reassessment - one wound 1 5 []  - 0 Complex Wound Assessment / Reassessment - multiple wounds []  - 0 Dermatologic / Skin Assessment (not related to wound area) ASSESSMENTS - Focused Assessment []  - 0 Circumferential Edema Measurements - multi extremities []  - 0 Nutritional Assessment / Counseling / Intervention []  - 0 Lower Extremity Assessment (monofilament, tuning fork, pulses) []  - 0 Peripheral Arterial Disease Assessment (using hand held doppler) ASSESSMENTS - Ostomy and/or Continence Assessment and Care []  - 0 Incontinence Assessment and Management []  - 0 Ostomy Care Assessment and Management (repouching, etc.) PROCESS - Coordination of Care X - Simple Patient / Family Education for ongoing care 1 15 []  - 0 Complex (extensive) Patient / Family Education for ongoing care []  - 0 Staff obtains Chiropractor, Records, T Results / Process Orders est []  - 0 Staff telephones HHA, Nursing Homes / Clarify orders / etc []  - 0 Routine Transfer to another Facility (non-emergent condition) []  - 0 Routine Hospital Admission (non-emergent condition) []  - 0 New Admissions / Manufacturing engineer / Ordering NPWT Apligraf, etc. , []  - 0 Emergency Hospital Admission (emergent condition) X- 1 10 Simple Discharge Coordination []  - 0 Complex (extensive) Discharge Coordination PROCESS - Special Needs []  - 0 Pediatric / Minor Patient Management []  - 0 Isolation Patient Management []  - 0 Hearing / Language / Visual special needs []  -  0 Assessment of Community assistance (transportation, D/C planning, etc.) []  - 0 Additional assistance / Altered mentation []  - 0 Support Surface(s) Assessment (bed, cushion, seat, etc.) INTERVENTIONS - Wound Cleansing / Measurement []  - 0  Simple Wound Cleansing - one wound []  - 0 Complex Wound Cleansing - multiple wounds []  - 0 Wound Imaging (photographs - any number of wounds) []  - 0 Wound Tracing (instead of photographs) []  - 0 Simple Wound Measurement - one wound []  - 0 Complex Wound Measurement - multiple wounds INTERVENTIONS - Wound Dressings []  - 0 Small Wound Dressing one or multiple wounds []  - 0 Medium Wound Dressing one or multiple wounds []  - 0 Large Wound Dressing one or multiple wounds []  - 0 Application of Medications - topical []  - 0 Application of Medications - injection INTERVENTIONS - Miscellaneous []  - 0 External ear exam Semiya, Peagler Bernedette ([id number]) [phone].pdf Page 3 of 8 []  - 0 Specimen Collection (cultures, biopsies, blood, body fluids, etc.) []  - 0 Specimen(s) / Culture(s) sent or taken to Lab for analysis []  - 0 Patient Transfer (multiple staff / Michiel Sites Lift / Similar devices) []  - 0 Simple Staple / Suture removal (25 or less) []  - 0 Complex Staple / Suture removal (26 or more) []  - 0 Hypo / Hyperglycemic Management (close monitor of Blood Glucose) []  - 0 Ankle / Brachial Index (ABI) - do not check if billed separately X- 1 5 Vital Signs Has the patient been seen at the hospital within the last three years: Yes Total Score: 50 Level Of Care: New/Established - Level 2 Electronic Signature(s) Signed: 02/11/2023 8:04:04 AM By: Yevonne Pax RN Entered By: Yevonne Pax on 02/04/2023 13:24:53 -------------------------------------------------------------------------------- Encounter Discharge Information Details Patient Name: Date of Service: [name], [name] 02/04/2023 12:30 PM Medical Record Number:  [id number] Patient Account Number: [id number] Date of Birth/Sex: Treating RN: 1941-06-25 (82 y.o. Freddy Finner Primary Care Braylen Staller: Darnelle Spangle Other Clinician: Referring Alvia Jablonski: Treating Circe Chilton/Extender: Paulina Fusi Weeks in Treatment: 12 Encounter Discharge Information Items Discharge Condition: Stable Ambulatory Status: Ambulatory Discharge Destination: Home Transportation: Private Auto Accompanied By: self Schedule Follow-up Appointment: Yes Clinical Summary of Care: Electronic Signature(s) Signed: 02/04/2023 1:26:22 PM By: Yevonne Pax RN Entered By: Yevonne Pax on 02/04/2023 13:26:22 Lower Extremity Assessment Details -------------------------------------------------------------------------------- Peggyann Juba ([id number]) [id number] Page 4 of 8 Patient Name: Date of Service: [name], [name] 02/04/2023 12:30 PM Medical Record Number: [id number] Patient Account Number: [id number] Date of Birth/Sex: Treating RN: 11-03-1940 (82 y.o. Freddy Finner Primary Care Famous Eisenhardt: Darnelle Spangle Other Clinician: Referring Tian Mcmurtrey: Treating Burgundy Matuszak/Extender: Paulina Fusi Weeks in Treatment: 12 Electronic Signature(s) Signed: 02/04/2023 1:23:18 PM By: Yevonne Pax RN Entered By: Yevonne Pax on 02/04/2023 13:23:18 -------------------------------------------------------------------------------- Multi Wound Chart Details Patient Name: Date of Service: [name], [name] 02/04/2023 12:30 PM Medical Record Number: [id number] Patient Account Number: [id number] Date of Birth/Sex: Treating RN: 1941-05-16 (82 y.o. Freddy Finner Primary Care Jerelene Salaam: Darnelle Spangle Other Clinician: Referring Ajanee Buren: Treating Tamanna Whitson/Extender: Paulina Fusi Weeks in Treatment: 12 Vital Signs Height(in): 64 Pulse(bpm): 59 Weight(lbs): 190 Blood Pressure(mmHg): 123/61 Body Mass Index(BMI): 32.6 Temperature(F):  97.7 Respiratory Rate(breaths/min): 18 [1:Photos: No Photos Right, Medial Lower Leg Wound Location: Trauma Wounding Event: Diabetic Wound/Ulcer of the Lower Primary Etiology: Extremity Lymphedema, Hypertension, Type II Comorbid History: Diabetes 10/12/2022 Date Acquired: 12 Weeks of Treatment: Open Wound  Status: No Wound Recurrence: 0x0x0 Measurements L x W x D (cm) 0 A (cm) : rea 0 Volume (cm) : 100.00% % Reduction in A rea: 100.00% % Reduction in Volume: Grade 1 Classification: None Present Exudate A mount: None Present (0%) Granulation A mount: None  Present (0%) Necrotic A mount: Fascia: No Exposed Structures: Fat Layer (  Subcutaneous Tissue): No Tendon: No Muscle: No Joint: No Bone: No None Epithelialization:] [N/A:N/A N/A N/A N/A N/A N/A N/A N/A N/A N/A N/A N/A N/A N/A N/A N/A N/A N/A N/A N/A] Treatment Notes Electronic Signature(s) Signed: 02/04/2023 1:23:48 PM By: Yevonne Pax RN Previous Signature: 02/04/2023 1:23:22 PM Version By: Yevonne Pax RN Michaelyn Barter, Aviyanna ([id number]) [phone].pdf Page 5 of 8 Previous Signature: 02/04/2023 1:23:22 PM Version By: Yevonne Pax RN Entered By: Yevonne Pax on 02/04/2023 13:23:48 -------------------------------------------------------------------------------- Multi-Disciplinary Care Plan Details Patient Name: Date of Service: LUZELENA, DURRANI 02/04/2023 12:30 PM Medical Record Number: [id number] Patient Account Number: [id number] Date of Birth/Sex: Treating RN: August 13, 1940 (82 y.o. Freddy Finner Primary Care Yareth Kearse: Darnelle Spangle Other Clinician: Referring Arienne Gartin: Treating Lynkin Saini/Extender: Paulina Fusi Weeks in Treatment: 12 Active Inactive Electronic Signature(s) Signed: 02/04/2023 1:25:16 PM By: Yevonne Pax RN Entered By: Yevonne Pax on 02/04/2023 13:25:16 -------------------------------------------------------------------------------- Pain Assessment Details Patient Name: Date of Service: [name], [name] 02/04/2023 12:30 PM Medical Record Number: [id number] Patient Account Number: [id number] Date of Birth/Sex: Treating RN: 07-25-1940 (82 y.o. Freddy Finner Primary Care Elford Evilsizer: Darnelle Spangle Other Clinician: Referring Nayib Remer: Treating Mikeisha Lemonds/Extender: Paulina Fusi Weeks in Treatment: 12 Active Problems Location of Pain Severity and Description of Pain Patient Has Paino No Site Locations [place], [name] ([id number]) [phone].pdf Page 6 of 8 Pain Management and Medication Current Pain Management: Electronic Signature(s) Signed: 02/11/2023 8:04:04 AM By: Yevonne Pax RN Entered By: Yevonne Pax on 02/04/2023 12:39:15 -------------------------------------------------------------------------------- Patient/Caregiver Education Details Patient Name: Date of Service: [name] 8/8/2024andnbsp12:30 PM Medical Record Number: [id number] Patient Account Number: [id number] Date of Birth/Gender: Treating RN: 1940/06/30 (82 y.o. Freddy Finner Primary Care Physician: Darnelle Spangle Other Clinician: Referring Physician: Treating Physician/Extender: Paulina Fusi Weeks in Treatment: 12 Education Assessment Education Provided To: Patient Education Topics Provided Wound/Skin Impairment: Handouts: Other: discharge instructions Methods: Explain/Verbal Responses: State content correctly Electronic Signature(s) Signed: 02/11/2023 8:04:04 AM By: Yevonne Pax RN Entered By: Yevonne Pax on 02/04/2023 13:25:49 Peggyann Juba ([id number]) [id number] Page 7 of 8 -------------------------------------------------------------------------------- Wound Assessment Details Patient Name: Date of Service: [name], [name] 02/04/2023 12:30 PM Medical Record Number: [id number] Patient Account Number: [id number] Date of Birth/Sex: Treating RN: [birth year] (82 y.o. Freddy Finner Primary Care  Tommey Barret: Darnelle Spangle Other Clinician: Referring Thresea Doble: Treating Shaka Zech/Extender: Paulina Fusi Weeks in Treatment: 12 Wound Status Wound Number: 1 Primary Etiology: Diabetic Wound/Ulcer of the Lower Extremity Wound Location: Right, Medial Lower Leg Wound Status: Open Wounding Event: Trauma Comorbid History: Lymphedema, Hypertension, Type II Diabetes Date Acquired: 10/12/2022 Weeks Of Treatment: 12 Clustered Wound: No Wound Measurements Length: (cm) Width: (cm) Depth: (cm) Area: (cm) Volume: (cm) 0 % Reduction in Area: 100% 0 % Reduction in Volume: 100% 0 Epithelialization: None 0 Tunneling: No 0 Undermining: No Wound Description Classification: Grade 1 Exudate Amount: None Present Foul Odor After Cleansing: No Slough/Fibrino No Wound Bed Granulation Amount: None Present (0%) Exposed Structure Necrotic Amount: None Present (0%) Fascia Exposed: No Fat Layer (Subcutaneous Tissue) Exposed: No Tendon Exposed: No Muscle Exposed: No Joint Exposed: No Bone Exposed: No Electronic Signature(s) Signed: 02/04/2023 1:23:02 PM By: Yevonne Pax RN Entered By: Yevonne Pax on 02/04/2023 13:23:02 -------------------------------------------------------------------------------- Vitals Details Patient Name: Date of Service: [name], Dimitra 02/04/2023 12:30 PM Medical Record Number: [id number] Patient Account Number: [id number] Date of Birth/Sex: Treating RN: 1940/07/13 (82 y.o. Freddy Finner Primary Care Waneta Fitting: Darnelle Spangle Other Clinician: Peggyann Juba ([id number]) [id number] Page 8 of 8 Referring Dorian Renfro:  Treating Saniya Tranchina/Extender: Paulina Fusi Weeks in Treatment: 12 Vital Signs Time Taken: 12:38 Temperature (F): 97.7 Height (in): 64 Pulse (bpm): 59 Weight (lbs): 190 Respiratory Rate (breaths/min): 18 Body Mass Index (BMI): 32.6 Blood Pressure (mmHg): 123/61 Reference Range: 80 - 120 mg /  dl Electronic Signature(s) Signed: 02/11/2023 8:04:04 AM By: Yevonne Pax RN Entered By: Yevonne Pax on 02/04/2023 12:39:09

## 2023-04-07 ENCOUNTER — Ambulatory Visit
Admission: EM | Admit: 2023-04-07 | Discharge: 2023-04-07 | Disposition: A | Discharge status: Home / Self Care | Payer: Medicare PPO | Attending: Family Medicine | Admitting: Family Medicine

## 2023-04-07 DIAGNOSIS — H0100B Unspecified blepharitis left eye, upper and lower eyelids: Secondary | ICD-10-CM

## 2023-04-07 DIAGNOSIS — H0100A Unspecified blepharitis right eye, upper and lower eyelids: Secondary | ICD-10-CM | POA: Diagnosis not present

## 2023-04-07 MED ORDER — ERYTHROMYCIN 5 MG/GM OP OINT: TOPICAL_OINTMENT | Freq: Three times a day (TID) | OPHTHALMIC | Status: DC

## 2023-04-07 NOTE — Discharge Instructions (Addendum)
Use the eye antibiotics three times a day for the next week. Stop by the pharmacy to pick up your antibiotic eye medication.  Follow up with your primary eyecare provider or [facility] if symptoms suddenly worsen or you have little improvement in your eye symptoms.   Apply warm compresses for 10 minutes to loosen up the debris around your eyes.  You can also use baby shampoo for cleansing of your eyelids.  Consider purchasing some hydrocortisone cream and applying to the outside of your eyes but be sure not to get it on the inside of your eyelids.

## 2023-04-07 NOTE — ED Provider Notes (Signed)
[facility]    CSN: [id number] Arrival date & time: 04/07/23  1141      History   Chief Complaint Chief Complaint  Patient presents with   Eye Problem    HPI HPI  [name] is a 82 y.o. female.    [name] presents for bilateral eye redness that started over a week ago.   Has been having discharge around her eyes.  She does not recall anything getting into her eye.  [name] has not had any trouble seeing.  However, eye redness remains.  [name] has otherwise been well and has no additional concerns today.  Uses eyedrops prescribed by her ophthalmologist and 1 over-the-counter eye drop daily.  Past Medical History:  Diagnosis Date   Diabetes mellitus without complication (HCC)    Hyperlipidemia    Hypertension     Patient Active Problem List   Diagnosis Date Noted   Lymphedema 07/26/2016   DJD (degenerative joint disease) 07/26/2016   Diabetes type 2, controlled (HCC) 07/26/2016   Essential hypertension 07/26/2016    Past Surgical History:  Procedure Laterality Date   REPLACEMENT TOTAL KNEE Left     OB History   No obstetric history on file.      Home Medications    Prior to Admission medications   Medication Sig Start Date End Date Taking? Authorizing Provider  acetaminophen (TYLENOL) 500 MG tablet Take 500 mg by mouth every 6 (six) hours as needed.     [provider]  amLODipine-benazepril (LOTREL) 5-10 MG capsule Take 1 capsule by mouth daily. 01/02/21   [provider]  amLODipine-olmesartan (AZOR) 5-40 MG tablet Take 1 tablet by mouth daily. 02/18/22 02/11/23  [provider]  calcium carbonate (OS-CAL) 600 MG TABS tablet Take by mouth daily.     [provider]  gabapentin (NEURONTIN) 100 MG capsule Take by mouth. 12/24/20   [provider]  levothyroxine (SYNTHROID) 100 MCG tablet  04/23/16   [provider]  levothyroxine (SYNTHROID) 100 MCG tablet Take 1 tablet by mouth  daily. 02/13/22 02/13/23  [provider]  metFORMIN (GLUCOPHAGE-XR) 500 MG 24 hr tablet SMARTSIG:1 Tablet(s) By Mouth Every Evening 01/03/21   [provider]  pravastatin (PRAVACHOL) 40 MG tablet Take 40 mg by mouth daily. 01/16/21   [provider]  traMADol (ULTRAM) 50 MG tablet Take 1 tablet (50 mg total) by mouth every 12 (twelve) hours as needed for moderate pain or severe pain. 07/04/22   Tommie Sams, DO  vitamin B-12 (CYANOCOBALAMIN) 500 MCG tablet Take by mouth daily.     [provider]    Family History Family History  Problem Relation Age of Onset   Stroke Mother    Heart disease Father     Social History Social History   Tobacco Use   Smoking status: Never   Smokeless tobacco: Never  Vaping Use   Vaping status: Never Used  Substance Use Topics   Alcohol use: No   Drug use: No     Allergies   Benazepril   Review of Systems Review of Systems : negative unless otherwise stated in HPI.      Physical Exam Triage Vital Signs ED Triage Vitals [04/07/23 1245]  Encounter Vitals Group     BP 124/61     Systolic BP Percentile      Diastolic BP Percentile      Pulse Rate 86     Resp      Temp 98.2 F (  36.8 C)     Temp Source Oral     SpO2 97 %     Weight      Height      Head Circumference      Peak Flow      Pain Score 0     Pain Loc      Pain Education      Exclude from Growth Chart    No data found.  Updated Vital Signs BP 124/61 (BP Location: Left Arm)   Pulse 86   Temp 98.2 F (36.8 C) (Oral)   SpO2 97%   Visual Acuity Right Eye Distance: 20/40 (Uncorrected) Left Eye Distance: 20/40 (Uncorrected) Bilateral Distance: 20/30 (Uncorrected)  Right Eye Near:   Left Eye Near:    Bilateral Near:     Physical Exam  GEN: pleasant elderly  female, in no acute distress  RESP: no increased work of breathing EYES:     General: Dried discharge on the upper and lower lids lids are everted, no foreign bodies  appreciated. Vision grossly intact. Gaze aligned appropriately.        Right eye: Mucoid discharge at the medial canthus, no hordeolum, no foreign bodies    Left eye: Mucoid discharge at the medial canthus, no hordeolum, no foreign bodies    Extraocular Movements: Extraocular movements intact.     Conjunctiva/sclera: Injected on the right but not on the left    Left eye: Left conjunctiva is injected. No chemosis or hemorrhage. SKIN: warm and dry   UC Treatments / Results  Labs (all labs ordered are listed, but only abnormal results are displayed) Labs Reviewed - No data to display  EKG   Radiology No results found.  Procedures Procedures (including critical care time)  Medications Ordered in UC Medications  erythromycin ophthalmic ointment ( Both Eyes Given 04/07/23 1321)    Initial Impression / Assessment and Plan / UC Course  I have reviewed the triage vital signs and the nursing notes.  Pertinent labs & imaging results that were available during my care of the patient were reviewed by me and considered in my medical decision making (see chart for details).     Patient is a 82 y.o. female who presents after bilateral eye redness and discharge. 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 On exam, she has a evidence of blepharitis bilaterally.  Applied erythromycin ointment here and patient was to use this at home 3 times daily for the next week. Advised to follow-up with an ophthalmologist or optometrist, if  discomfort/pain is not improving after 7day course.  Recommended warm compresses and baby shampoo to clean the eyelids.  Avoid eye make-up.  Understanding voiced.   Discussed MDM, treatment plan and plan for follow-up with patient who agrees with plan.  Final Clinical Impressions(s) / UC Diagnoses   Final diagnoses:  Blepharitis of upper and lower eyelids of both eyes, unspecified type     Discharge Instructions      Use the  eye antibiotics three times a day for the next week. Stop by the pharmacy to pick up your antibiotic eye medication.  Follow up with your primary eyecare provider or [facility] if symptoms suddenly worsen or you have little improvement in your eye symptoms.   Apply warm compresses for 10 minutes to loosen up the debris around your eyes.  You can also use baby shampoo for cleansing of your eyelids.  Consider purchasing some hydrocortisone cream and applying  to the outside of your eyes but be sure not to get it on the inside of your eyelids.      ED Prescriptions   None    PDMP not reviewed this encounter.   Katha Cabal, DO 04/07/23 1341

## 2023-04-07 NOTE — ED Triage Notes (Signed)
Pt presents to UC c/o bilateral eye redness onset x1 week ago. Pt states she normally does OTC eye drops during the day and a prescription at night. Pt states they do not itch only redness.

## 2024-04-12 IMAGING — MR MR SHOULDER*R* W/O CM
4 of 5 series · 31 of 40 positions shown · non-contrast
Comparison: None Available.

CLINICAL DATA: Right shoulder pain with limited range of motion
status post fall

EXAM:
MRI OF THE RIGHT SHOULDER WITHOUT CONTRAST
TECHNIQUE: Multiplanar, multisequence MR imaging of the shoulder was performed.
No intravenous contrast was administered.

[Series 5: T2 fat-sat · axial · right · 4.0mm · 0.44mm/px · z∈[-30,+75]mm · 8 of 26 slices shown (1 of 3)]
[im 1/26]
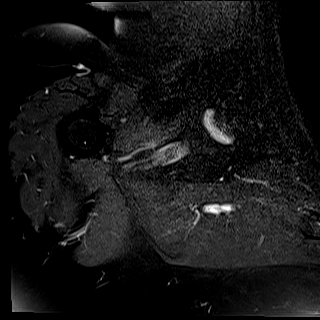
[im 4/26]
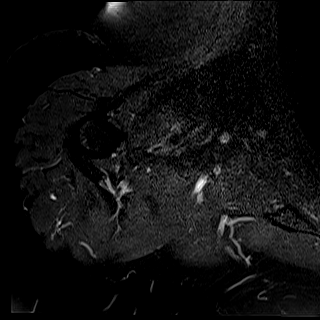
[im 8/26]
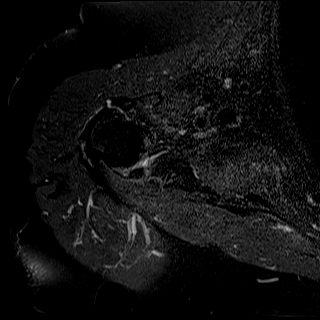
[im 11/26]
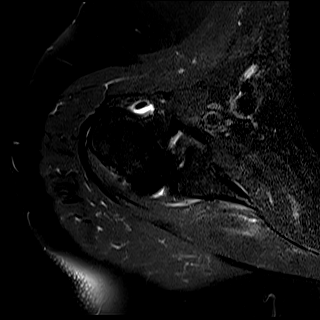
[im 15/26]
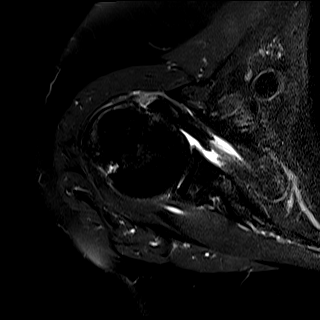
[im 18/26]
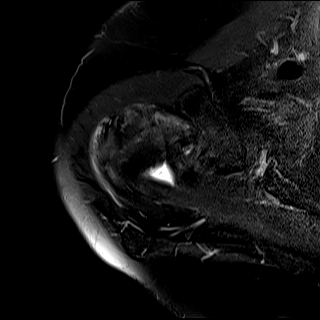
[im 22/26]
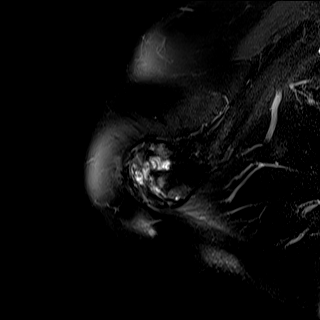
[im 26/26]
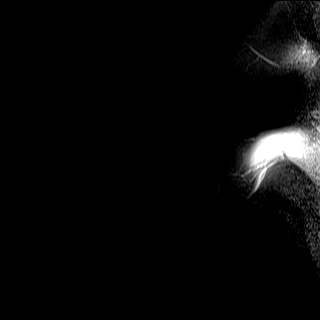

[Series 6: PD · oblique · right · 4.0mm · 0.44mm/px · 9 of 26 slices shown]
[im 1/26]
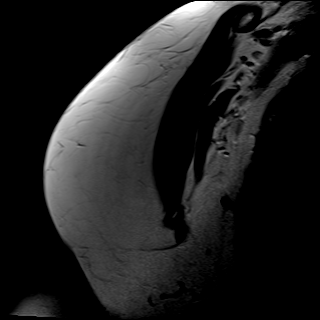
[im 4/26]
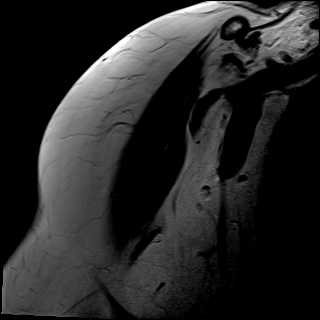
[im 7/26]
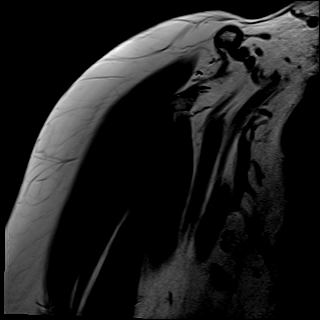
[im 10/26]
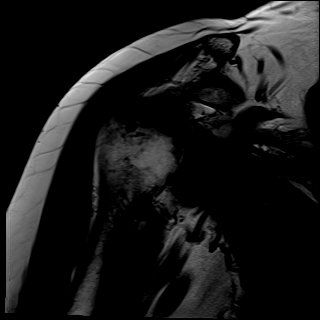
[im 13/26]
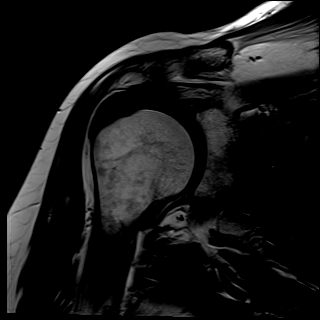
[im 16/26]
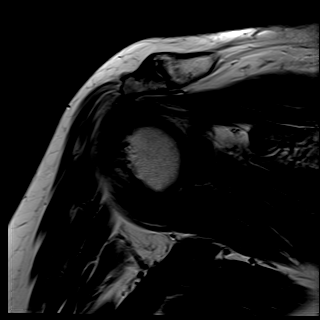
[im 19/26]
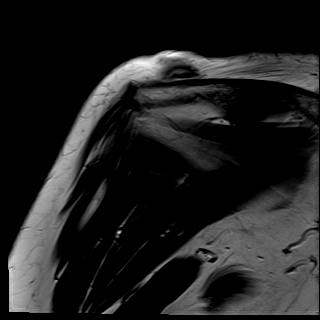
[im 22/26]
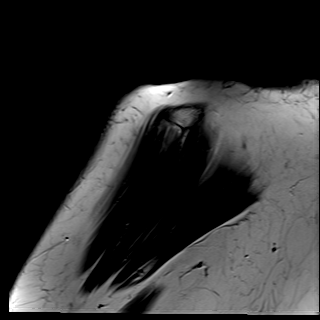
[im 26/26]
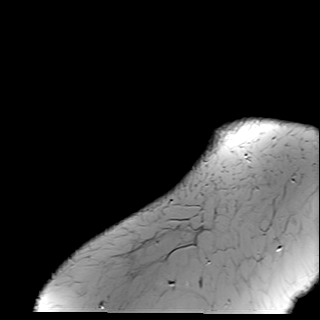

[Series 7: T2 fat-sat · oblique · right · 4.0mm · 0.44mm/px · 9 of 26 slices shown (2 of 3)]
[im 1/26]
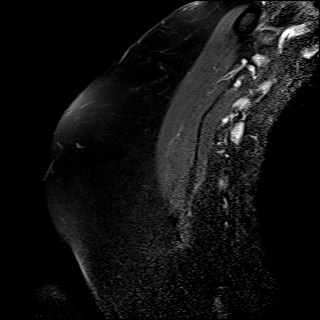
[im 4/26]
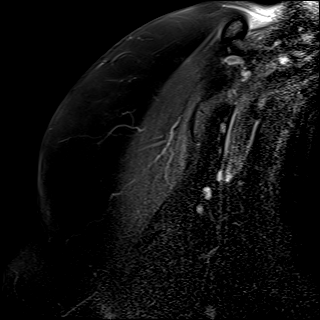
[im 7/26]
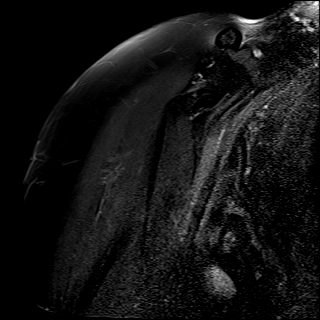
[im 10/26]
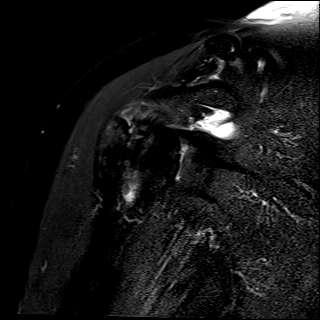
[im 13/26]
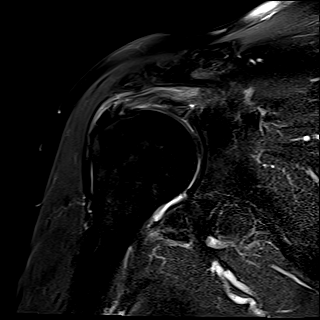
[im 16/26]
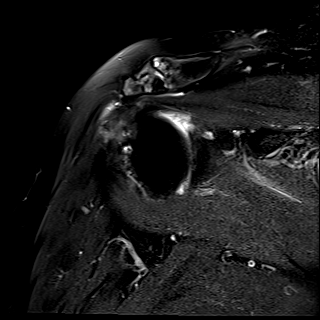
[im 19/26]
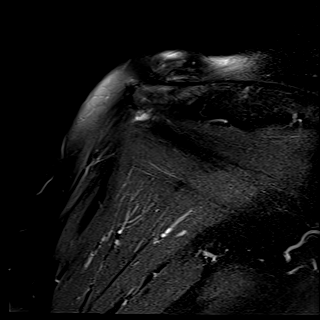
[im 22/26]
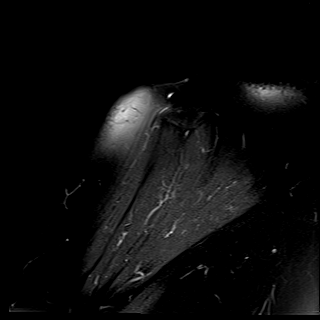
[im 26/26]
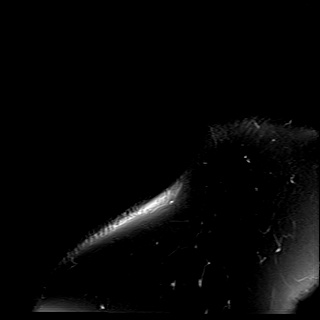

[Series 8: T2 fat-sat · oblique · right · 4.0mm · 0.22mm/px · 5 of 22 slices shown (3 of 3)]
[im 1/22]
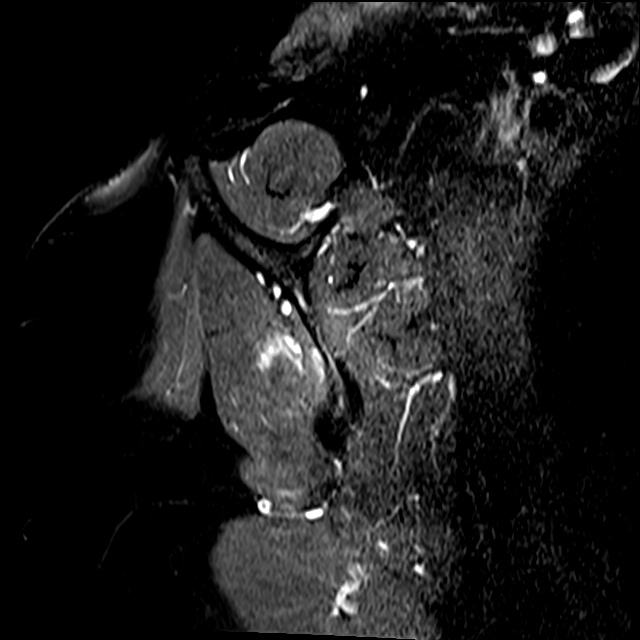
[im 4/22]
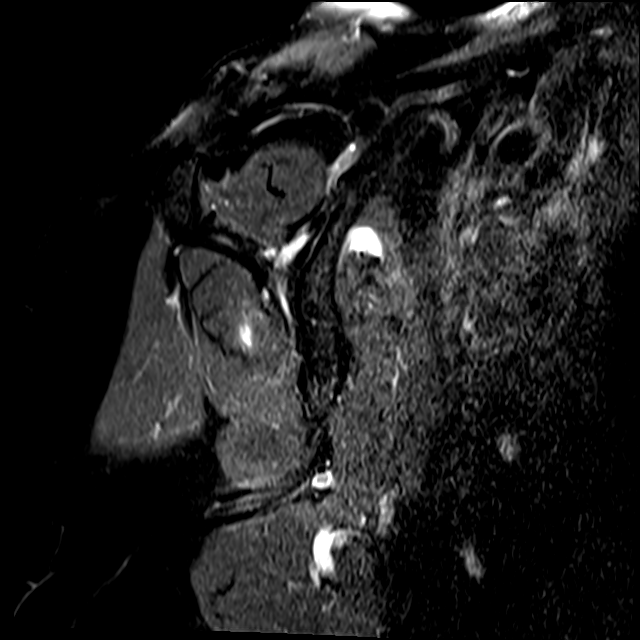
[im 8/22]
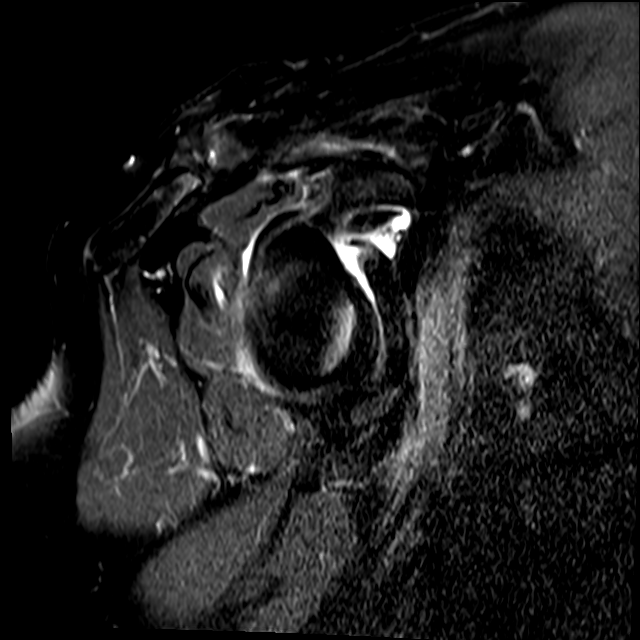
[im 11/22]
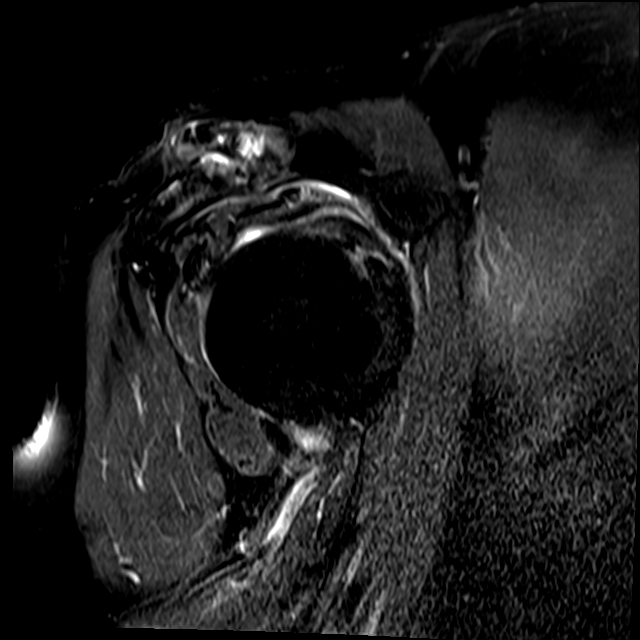
[im 18/22]
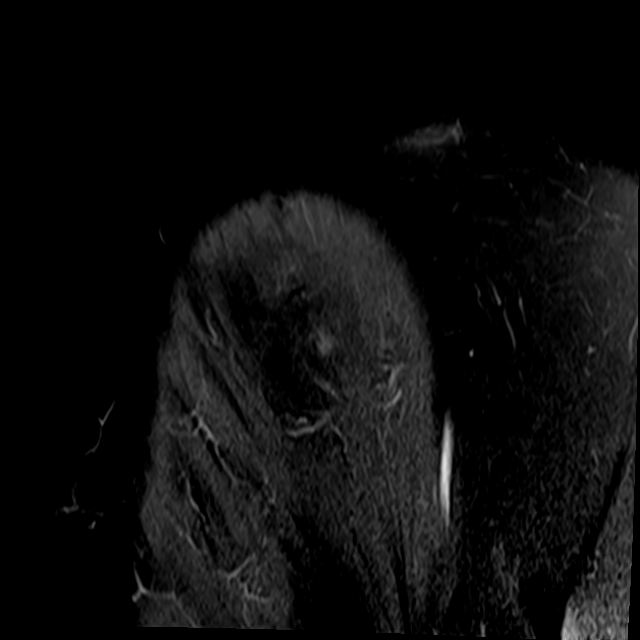

[31 of 40 positions shown; findings below may reference images not displayed]

FINDINGS: Rotator cuff: Severe tendinosis of the supraspinatus tendon with a
small insertional interstitial tear. Severe tendinosis of the
infraspinatus tendon with a small interstitial tear of the
musculotendinous junction. Teres minor tendon is intact.
Subscapularis tendon is intact.

Muscles: No muscle atrophy. Mild edema in the infraspinatus muscle
likely reflecting mild muscle strain. No intramuscular fluid
collection or hematoma.

Biceps Long Head: Severe tendinosis of the intra-articular portion
of the long head of the biceps tendon.

Acromioclavicular Joint: Moderate arthropathy of the
acromioclavicular joint. Trace subacromial/subdeltoid bursal fluid.

Glenohumeral Joint: No joint effusion. No chondral defect.

Labrum: Grossly intact, but evaluation is limited by lack of
intraarticular fluid/contrast.

Bones: No fracture or dislocation. No aggressive osseous lesion.

Other: No fluid collection or hematoma.
IMPRESSION: 1. Severe tendinosis of the supraspinatus tendon with a small
insertional interstitial tear.
2. Severe tendinosis of the infraspinatus tendon with a small
interstitial tear at the musculotendinous junction.
3. Severe tendinosis of the intra-articular portion of the long head
of the biceps tendon.

## 2024-05-17 ENCOUNTER — Ambulatory Visit: Admission: EM | Admit: 2024-05-17 | Discharge: 2024-05-17 | Discharge status: Against Medical Advice
